# Patient Record
Sex: Male | Born: 1960 | Race: White | Hispanic: No | Marital: Married | Smoking: Never smoker
Health system: Southern US, Community
[De-identification: ages and names within clinical notes are randomized; demographics above are authoritative.]

## PROBLEM LIST (undated history)

## (undated) ENCOUNTER — Inpatient Hospital Stay: Admission: EM | Payer: Self-pay | Source: Home / Self Care

## (undated) DIAGNOSIS — E7212 Methylenetetrahydrofolate reductase deficiency: Secondary | ICD-10-CM

## (undated) DIAGNOSIS — Z86010 Personal history of colonic polyps: Secondary | ICD-10-CM

## (undated) DIAGNOSIS — R3129 Other microscopic hematuria: Secondary | ICD-10-CM

## (undated) DIAGNOSIS — Z87898 Personal history of other specified conditions: Secondary | ICD-10-CM

## (undated) DIAGNOSIS — D3A8 Other benign neuroendocrine tumors: Secondary | ICD-10-CM

## (undated) DIAGNOSIS — R011 Cardiac murmur, unspecified: Secondary | ICD-10-CM

## (undated) HISTORY — PX: COLONOSCOPY: SHX174

## (undated) HISTORY — DX: Personal history of colonic polyps: Z86.010

## (undated) HISTORY — DX: Other microscopic hematuria: R31.29

## (undated) HISTORY — DX: Methylenetetrahydrofolate reductase deficiency: E72.12

## (undated) HISTORY — DX: Other benign neuroendocrine tumors: D3A.8

## (undated) HISTORY — DX: Cardiac murmur, unspecified: R01.1

## (undated) HISTORY — DX: Personal history of other specified conditions: Z87.898

---

## 1987-12-28 HISTORY — PX: BRAIN TUMOR EXCISION: SHX577

## 1989-12-27 HISTORY — PX: HYDROCELE EXCISION / REPAIR: SUR1145

## 2004-09-03 ENCOUNTER — Encounter: Payer: Self-pay | Admitting: Internal Medicine

## 2007-09-01 ENCOUNTER — Ambulatory Visit: Payer: Self-pay | Admitting: Internal Medicine

## 2007-09-01 LAB — CONVERTED CEMR LAB
ALT: 29 units/L (ref 0–53)
AST: 21 units/L (ref 0–37)
Albumin: 4.2 g/dL (ref 3.5–5.2)
Alkaline Phosphatase: 66 units/L (ref 39–117)
BUN: 12 mg/dL (ref 6–23)
Basophils Absolute: 0 10*3/uL (ref 0.0–0.1)
Basophils Relative: 0.6 % (ref 0.0–1.0)
Bilirubin Urine: NEGATIVE
Bilirubin, Direct: 0.1 mg/dL (ref 0.0–0.3)
CO2: 28 meq/L (ref 19–32)
Calcium: 9.5 mg/dL (ref 8.4–10.5)
Chloride: 103 meq/L (ref 96–112)
Cholesterol: 181 mg/dL (ref 0–200)
Creatinine, Ser: 1 mg/dL (ref 0.4–1.5)
Eosinophils Absolute: 0.4 10*3/uL (ref 0.0–0.6)
Eosinophils Relative: 6.3 % — ABNORMAL HIGH (ref 0.0–5.0)
GFR calc Af Amer: 103 mL/min
GFR calc non Af Amer: 85 mL/min
Glucose, Bld: 107 mg/dL — ABNORMAL HIGH (ref 70–99)
Glucose, Urine, Semiquant: NEGATIVE
HCT: 45.7 % (ref 39.0–52.0)
HDL: 68.5 mg/dL (ref >39.0)
Hemoglobin: 15.4 g/dL (ref 13.0–17.0)
Ketones, urine, test strip: NEGATIVE
LDL Cholesterol: 90 mg/dL (ref 0–99)
Lymphocytes Relative: 36.8 % (ref 12.0–46.0)
MCHC: 33.7 g/dL (ref 30.0–36.0)
MCV: 98.9 fL (ref 78.0–100.0)
Monocytes Absolute: 0.6 10*3/uL (ref 0.2–0.7)
Monocytes Relative: 9.2 % (ref 3.0–11.0)
Neutro Abs: 3.4 10*3/uL (ref 1.4–7.7)
Neutrophils Relative %: 47.1 % (ref 43.0–77.0)
Nitrite: NEGATIVE
Platelets: 347 10*3/uL (ref 150–400)
Potassium: 4.5 meq/L (ref 3.5–5.1)
Protein, U semiquant: NEGATIVE
RBC: 4.62 M/uL (ref 4.22–5.81)
RDW: 12.4 % (ref 11.5–14.6)
Sodium: 139 meq/L (ref 135–145)
Specific Gravity, Urine: 1.025
TSH: 2.03 microintl units/mL (ref 0.35–5.50)
Total Bilirubin: 1.3 mg/dL — ABNORMAL HIGH (ref 0.3–1.2)
Total CHOL/HDL Ratio: 2.6
Total Protein: 7.5 g/dL (ref 6.0–8.3)
Triglycerides: 113 mg/dL (ref 0–149)
Urobilinogen, UA: 0.2
VLDL: 23 mg/dL (ref 0–40)
WBC Urine, dipstick: NEGATIVE
WBC: 7 10*3/uL (ref 4.5–10.5)
pH: 5.5

## 2007-09-08 ENCOUNTER — Ambulatory Visit: Payer: Self-pay | Admitting: Internal Medicine

## 2008-08-09 ENCOUNTER — Ambulatory Visit: Payer: Self-pay | Admitting: Internal Medicine

## 2008-09-16 ENCOUNTER — Ambulatory Visit: Payer: Self-pay | Admitting: Internal Medicine

## 2008-10-16 ENCOUNTER — Telehealth: Payer: Self-pay | Admitting: Internal Medicine

## 2008-11-01 ENCOUNTER — Telehealth (INDEPENDENT_AMBULATORY_CARE_PROVIDER_SITE_OTHER): Payer: Self-pay | Admitting: *Deleted

## 2008-12-27 LAB — HM COLONOSCOPY

## 2009-05-05 ENCOUNTER — Ambulatory Visit: Payer: Self-pay | Admitting: Internal Medicine

## 2009-05-05 LAB — CONVERTED CEMR LAB
ALT: 25 units/L (ref 0–53)
Basophils Relative: 0.9 % (ref 0.0–3.0)
Bilirubin Urine: NEGATIVE
Bilirubin, Direct: 0.2 mg/dL (ref 0.0–0.3)
Chloride: 107 meq/L (ref 96–112)
Cholesterol: 173 mg/dL (ref 0–200)
Eosinophils Relative: 6.8 % — ABNORMAL HIGH (ref 0.0–5.0)
HCT: 48.9 % (ref 39.0–52.0)
Hemoglobin: 16.7 g/dL (ref 13.0–17.0)
LDL Cholesterol: 83 mg/dL (ref 0–99)
Lymphs Abs: 1.9 10*3/uL (ref 0.7–4.0)
MCV: 100.4 fL — ABNORMAL HIGH (ref 78.0–100.0)
Monocytes Absolute: 0.5 10*3/uL (ref 0.1–1.0)
Nitrite: NEGATIVE
Potassium: 5.3 meq/L — ABNORMAL HIGH (ref 3.5–5.1)
RBC: 4.87 M/uL (ref 4.22–5.81)
TSH: 1.85 microintl units/mL (ref 0.35–5.50)
Total CHOL/HDL Ratio: 3
Total Protein: 7.1 g/dL (ref 6.0–8.3)
Urobilinogen, UA: 0.2
WBC: 5.5 10*3/uL (ref 4.5–10.5)

## 2009-05-12 ENCOUNTER — Ambulatory Visit: Payer: Self-pay | Admitting: Internal Medicine

## 2009-05-12 DIAGNOSIS — C719 Malignant neoplasm of brain, unspecified: Secondary | ICD-10-CM | POA: Insufficient documentation

## 2009-05-12 DIAGNOSIS — R29898 Other symptoms and signs involving the musculoskeletal system: Secondary | ICD-10-CM | POA: Insufficient documentation

## 2009-05-12 DIAGNOSIS — M79609 Pain in unspecified limb: Secondary | ICD-10-CM

## 2009-05-13 ENCOUNTER — Ambulatory Visit: Payer: Self-pay | Admitting: Internal Medicine

## 2009-05-16 ENCOUNTER — Telehealth: Payer: Self-pay | Admitting: Internal Medicine

## 2009-05-19 ENCOUNTER — Telehealth: Payer: Self-pay | Admitting: Internal Medicine

## 2009-05-19 ENCOUNTER — Encounter: Admission: RE | Admit: 2009-05-19 | Discharge: 2009-05-19 | Payer: Self-pay | Admitting: Internal Medicine

## 2009-05-29 DIAGNOSIS — D492 Neoplasm of unspecified behavior of bone, soft tissue, and skin: Secondary | ICD-10-CM

## 2009-06-03 ENCOUNTER — Ambulatory Visit: Payer: Self-pay | Admitting: Internal Medicine

## 2009-06-06 ENCOUNTER — Encounter: Payer: Self-pay | Admitting: Internal Medicine

## 2009-06-06 ENCOUNTER — Encounter (INDEPENDENT_AMBULATORY_CARE_PROVIDER_SITE_OTHER): Payer: Self-pay | Admitting: Interventional Radiology

## 2009-06-06 ENCOUNTER — Ambulatory Visit (HOSPITAL_COMMUNITY): Admission: RE | Admit: 2009-06-06 | Discharge: 2009-06-06 | Payer: Self-pay | Admitting: Internal Medicine

## 2009-06-12 ENCOUNTER — Telehealth: Payer: Self-pay | Admitting: Internal Medicine

## 2009-06-17 ENCOUNTER — Telehealth: Payer: Self-pay | Admitting: Internal Medicine

## 2009-06-19 ENCOUNTER — Encounter: Payer: Self-pay | Admitting: Internal Medicine

## 2009-06-19 ENCOUNTER — Ambulatory Visit: Payer: Self-pay | Admitting: Internal Medicine

## 2009-06-19 DIAGNOSIS — Z860100 Personal history of colon polyps, unspecified: Secondary | ICD-10-CM

## 2009-06-19 HISTORY — DX: Personal history of colonic polyps: Z86.010

## 2009-06-19 HISTORY — DX: Personal history of colon polyps, unspecified: Z86.0100

## 2009-06-22 ENCOUNTER — Encounter: Payer: Self-pay | Admitting: Internal Medicine

## 2009-10-27 IMAGING — US US BIOPSY
1 series · 3 of 3 positions shown · non-contrast
Comparison: none

CLINICAL DATA: Palpable left thigh mass.  Remote history of
astrocytoma.

ULTRASOUND-GUIDED LEFT THIGH ASPIRATION BIOPSY
TECHNIQUE: Survey ultrasound of the left thigh was performed and
the deep subcutaneous lesion was identified.  This measures
approximately 8 x 10 x 18 mm.  An appropriate skin entry site was
determined.  Site was marked, prepped with Betadine, draped in
usual sterile fashion, infiltrated locally with 1% lidocaine.
Intravenous Fentanyl and Versed were administered as conscious
sedation during continuous cardiorespiratory monitoring by the
radiology RN.
Under real time ultrasound guidance, three aspiration biopsy
samples of the lesion were obtained with 18 gauge needles, sent for
both cytology and microbiology analysis. The patient tolerated the
procedure well. No immediate complication
IMPRESSION
1.  Technically successful ultrasound-guided aspiration biopsy of
left thigh lesion.

[Series 1: us biopsy · 0.08mm/px · 3 acquisitions, 3 frames shown]
[im 1/3]
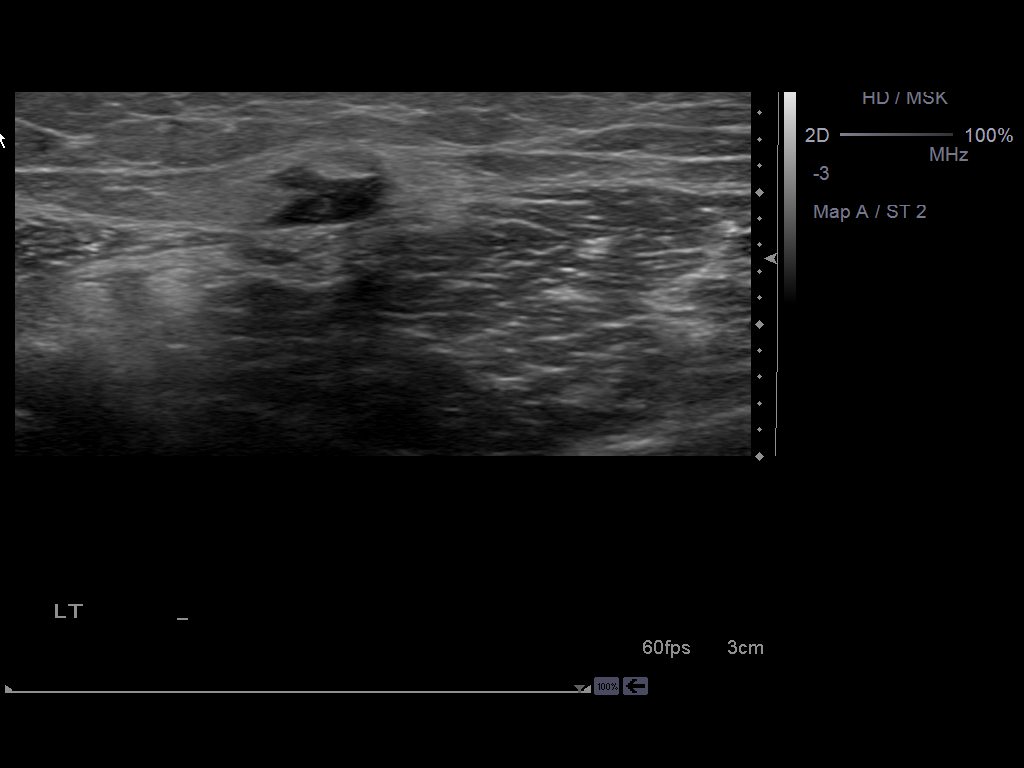
[im 2/3]
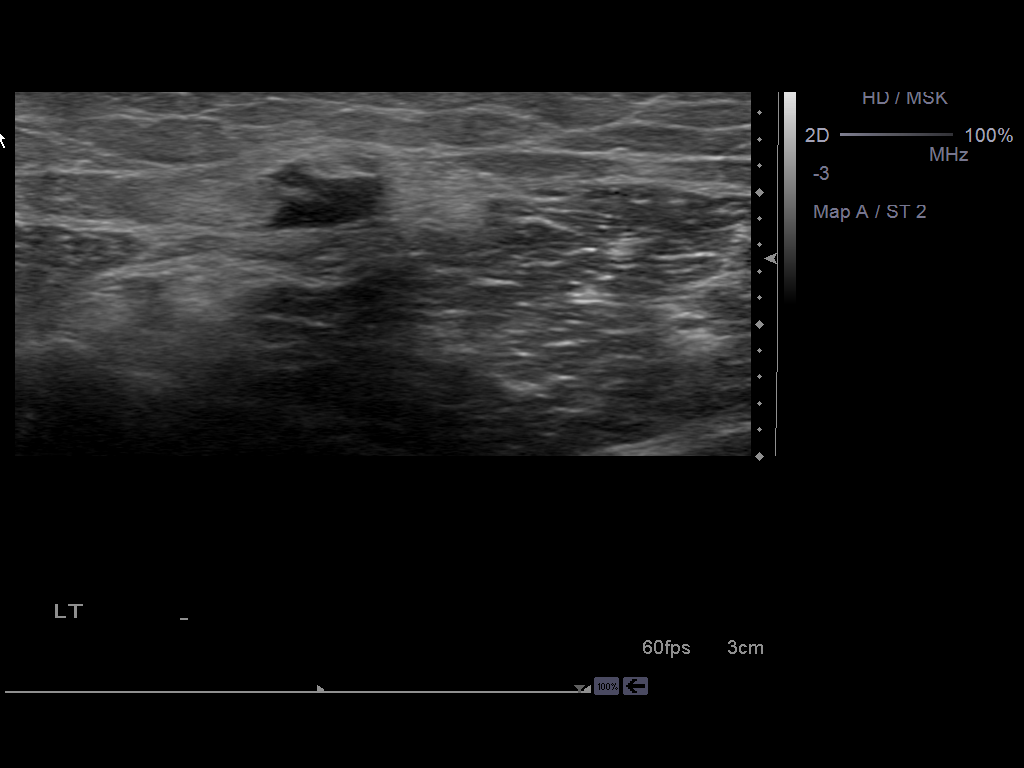
[im 3/3]
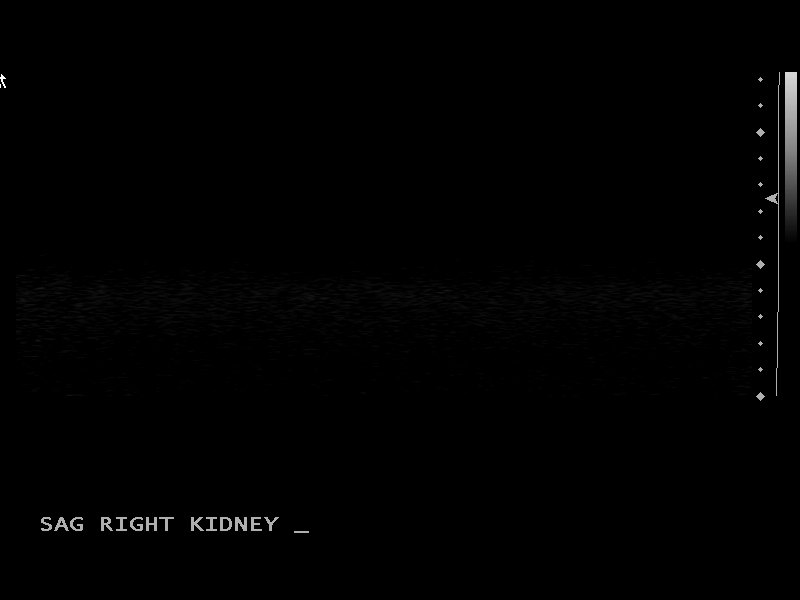

[3 of 3 positions shown; findings below may reference images not displayed]

## 2010-02-06 ENCOUNTER — Telehealth (INDEPENDENT_AMBULATORY_CARE_PROVIDER_SITE_OTHER): Payer: Self-pay | Admitting: *Deleted

## 2011-01-26 NOTE — Progress Notes (Signed)
Summary: Records request from [facility]  Request for records received from [facility]. Request forwarded to [facility].Dena Chavis  February 06, 2010 11:20 AM

## 2011-04-05 LAB — CBC
MCV: 100.8 fL — ABNORMAL HIGH (ref 78.0–100.0)
Platelets: 299 10*3/uL (ref 150–400)
WBC: 7.3 10*3/uL (ref 4.0–10.5)

## 2011-04-05 LAB — APTT: aPTT: 25 seconds (ref 24–37)

## 2011-04-05 LAB — CULTURE, ROUTINE-ABSCESS: Culture: NO GROWTH

## 2011-04-05 LAB — PROTIME-INR
INR: 1 (ref 0.00–1.49)
Prothrombin Time: 13.1 seconds (ref 11.6–15.2)

## 2011-07-02 ENCOUNTER — Other Ambulatory Visit: Payer: Self-pay | Admitting: Dermatology

## 2012-09-26 ENCOUNTER — Other Ambulatory Visit: Payer: Self-pay | Admitting: Dermatology

## 2013-04-05 ENCOUNTER — Ambulatory Visit (INDEPENDENT_AMBULATORY_CARE_PROVIDER_SITE_OTHER): Payer: 59 | Admitting: Family

## 2013-04-05 ENCOUNTER — Encounter: Payer: Self-pay | Admitting: Family

## 2013-04-05 VITALS — BP 130/90 | HR 73 | Temp 97.7°F | Resp 16 | Ht 73.75 in | Wt 181.0 lb

## 2013-04-05 DIAGNOSIS — H9190 Unspecified hearing loss, unspecified ear: Secondary | ICD-10-CM

## 2013-04-05 DIAGNOSIS — Z85841 Personal history of malignant neoplasm of brain: Secondary | ICD-10-CM

## 2013-04-05 DIAGNOSIS — Z86010 Personal history of colonic polyps: Secondary | ICD-10-CM

## 2013-04-05 DIAGNOSIS — Z0000 Encounter for general adult medical examination without abnormal findings: Secondary | ICD-10-CM

## 2013-04-05 DIAGNOSIS — Z23 Encounter for immunization: Secondary | ICD-10-CM

## 2013-04-05 DIAGNOSIS — F1010 Alcohol abuse, uncomplicated: Secondary | ICD-10-CM

## 2013-04-05 DIAGNOSIS — C719 Malignant neoplasm of brain, unspecified: Secondary | ICD-10-CM

## 2013-04-05 NOTE — Patient Instructions (Addendum)
Return fasting for lab work. Follow up in 1 year, sooner if problems/concerns. You will be contacted about your referral to audiology and for MRI.  Please let us know if you have not heard back within 1 week about your referrals.

## 2013-04-05 NOTE — Progress Notes (Signed)
Subjective:    Patient ID: Jason Arnold, male    DOB: [birth year], 52 y.o.   MRN: [id number]  HPI  Patient presents today to establish care.  He is also interested in arranging several referrals.  Immunizations: due for Tdap Diet: reports healthy diet- does admit to drinking 6 bourbons/day Exercise: walks in yard. active Colonoscopy: has had colo with polyp 2010 ([facility]) due for follow up in 2015.   Reports hx of brain tumor in his 20's removed most of the brain tumor. He has ben followed with serial MRI brain. Requests referral for follow up MRI.  Hearing loss- reports that he was told that he had some hearing loss on work physical. Requests referral to audiology.   Review of Systems See HPI  Past Medical History  Diagnosis Date  . 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 Marital Status: Married    Spouse Name: N/A    Number of Children: 2  . Years of Education: N/A   Occupational History  .     Social History Main Topics  . Smoking status: Never Smoker   . Smokeless tobacco: Never Used  . Alcohol Use: 21.0 oz/week    42 drink(s) per week     Comment: has about 6 drinks a day  . Drug Use: Not on file  . Sexually Active: Not on file   Other Topics Concern  . 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   Enjoys reading, working outside.   Son 28, girl 60   Completed doctoral degree in chemistry   Has a dog    Past Surgical History  Procedure Laterality Date  . Hydrocele excision / repair  1991  . 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 Cancer Mother     history of colon cancer in her 76's  . Hyperlipidemia Father   . Heart attack Father     No Known Allergies  No current outpatient prescriptions on file prior to visit.    No current facility-administered medications on file prior to visit.    BP 130/90  Pulse 73  Temp(Src) 97.7 F (36.5 C) (Oral)  Resp 16  Ht 6' 1.75" (1.873 m)  Wt 181 lb 0.6 oz (82.119 kg)  BMI 23.41 kg/m2  SpO2 99%       Objective:   Physical Exam Physical Exam  Constitutional: He is oriented to person, place, and time. He appears well-developed and well-nourished. No distress.  HENT:  Head: Normocephalic and atraumatic.  Right Ear: Tympanic membrane and ear canal normal.  Left Ear: Tympanic membrane and ear canal normal.  Mouth/Throat: Oropharynx is clear and moist.  Eyes: Pupils are equal, round, and reactive to light. No scleral icterus.  Neck: Normal range of motion. No thyromegaly present.  Cardiovascular: Normal rate and regular rhythm.   No murmur heard. Pulmonary/Chest: Effort normal and breath sounds normal. No respiratory distress. He has no wheezes. He has no rales. He exhibits no tenderness.  Abdominal: Soft. Bowel sounds are normal. He exhibits no distension and no mass. There is no tenderness. There is no rebound and no guarding.  Musculoskeletal: He exhibits no edema.  Lymphadenopathy:  He has no cervical adenopathy.  Neurological: He is alert and oriented to person, place, and time.  He exhibits normal muscle tone. Coordination normal.  Skin: Skin is warm and dry.  GU:  Normal prostate, stool heme neg Psychiatric: He has a normal mood and affect. His behavior is normal. Judgment and thought content normal.       Assessment & Plan:         Assessment & Plan:

## 2013-04-07 ENCOUNTER — Ambulatory Visit (HOSPITAL_BASED_OUTPATIENT_CLINIC_OR_DEPARTMENT_OTHER): Payer: 59

## 2013-04-08 DIAGNOSIS — F1010 Alcohol abuse, uncomplicated: Secondary | ICD-10-CM | POA: Insufficient documentation

## 2013-04-08 DIAGNOSIS — Z0000 Encounter for general adult medical examination without abnormal findings: Secondary | ICD-10-CM | POA: Insufficient documentation

## 2013-04-08 DIAGNOSIS — H9190 Unspecified hearing loss, unspecified ear: Secondary | ICD-10-CM | POA: Insufficient documentation

## 2013-04-08 NOTE — Assessment & Plan Note (Signed)
Will order MRI brain

## 2013-04-08 NOTE — Assessment & Plan Note (Signed)
Refer to audiology.

## 2013-04-08 NOTE — Assessment & Plan Note (Signed)
Drinks 6 drinks a day.  Advised pt re: recommendations for men not to exceed 2 drinks/day due to increased health risks.

## 2013-04-08 NOTE — Assessment & Plan Note (Signed)
Continue healthy diet, exercise, Tdap today.  He will return for fasting labs and PSA (we discussed pros/cons).

## 2013-04-14 ENCOUNTER — Ambulatory Visit (HOSPITAL_BASED_OUTPATIENT_CLINIC_OR_DEPARTMENT_OTHER)
Admit: 2013-04-14 | Discharge: 2013-04-14 | Disposition: A | Discharge status: Home / Self Care | Payer: 59 | Source: Ambulatory Visit | Attending: Family | Admitting: Family

## 2013-04-14 ENCOUNTER — Other Ambulatory Visit (HOSPITAL_BASED_OUTPATIENT_CLINIC_OR_DEPARTMENT_OTHER): Payer: 59

## 2013-04-14 DIAGNOSIS — Z09 Encounter for follow-up examination after completed treatment for conditions other than malignant neoplasm: Secondary | ICD-10-CM | POA: Insufficient documentation

## 2013-04-14 DIAGNOSIS — Z85841 Personal history of malignant neoplasm of brain: Secondary | ICD-10-CM | POA: Insufficient documentation

## 2013-04-14 MED ORDER — GADOBENATE DIMEGLUMINE 529 MG/ML IV SOLN
20.0000 mL | Freq: Once | INTRAVENOUS | Status: AC | PRN
  Administered 2013-04-14: 20 mL via INTRAVENOUS

## 2013-04-19 LAB — TSH: TSH: 2.341 u[IU]/mL (ref 0.350–4.500)

## 2013-04-19 LAB — LIPID PANEL
Cholesterol: 178 mg/dL (ref 0–200)
HDL: 77 mg/dL (ref >39)
Triglycerides: 71 mg/dL (ref <150)

## 2013-04-19 LAB — BASIC METABOLIC PANEL WITH GFR
CO2: 28 mEq/L (ref 19–32)
Calcium: 10 mg/dL (ref 8.4–10.5)
Creat: 0.91 mg/dL (ref 0.50–1.35)
GFR, Est African American: >89 mL/min
Glucose, Bld: 91 mg/dL (ref 70–99)

## 2013-04-19 LAB — HEPATIC FUNCTION PANEL
Albumin: 4.6 g/dL (ref 3.5–5.2)
Total Protein: 7.2 g/dL (ref 6.0–8.3)

## 2013-04-19 LAB — CBC WITH DIFFERENTIAL/PLATELET
Basophils Absolute: 0.1 10*3/uL (ref 0.0–0.1)
Basophils Relative: 2 % — ABNORMAL HIGH (ref 0–1)
Eosinophils Absolute: 0.2 10*3/uL (ref 0.0–0.7)
Eosinophils Relative: 4 % (ref 0–5)
MCH: 34 pg (ref 26.0–34.0)
MCV: 93.6 fL (ref 78.0–100.0)
Neutrophils Relative %: 47 % (ref 43–77)
Platelets: 367 10*3/uL (ref 150–400)
RDW: 13.3 % (ref 11.5–15.5)

## 2013-04-20 LAB — URINALYSIS, ROUTINE W REFLEX MICROSCOPIC
Glucose, UA: NEGATIVE mg/dL
Ketones, ur: NEGATIVE mg/dL
Nitrite: NEGATIVE
Specific Gravity, Urine: 1.006 (ref 1.005–1.030)
pH: 7 (ref 5.0–8.0)

## 2013-04-20 LAB — URINALYSIS, MICROSCOPIC ONLY
Casts: NONE SEEN
Crystals: NONE SEEN
Squamous Epithelial / LPF: NONE SEEN

## 2013-04-23 ENCOUNTER — Encounter: Payer: Self-pay | Admitting: Family

## 2014-05-14 ENCOUNTER — Other Ambulatory Visit: Payer: Self-pay | Admitting: Family

## 2014-05-14 ENCOUNTER — Ambulatory Visit (INDEPENDENT_AMBULATORY_CARE_PROVIDER_SITE_OTHER): Payer: 59 | Admitting: Family

## 2014-05-14 ENCOUNTER — Encounter: Payer: Self-pay | Admitting: Family

## 2014-05-14 VITALS — BP 124/82 | HR 77 | Temp 97.7°F | Resp 16 | Ht 73.5 in | Wt 180.0 lb

## 2014-05-14 DIAGNOSIS — R1030 Lower abdominal pain, unspecified: Secondary | ICD-10-CM

## 2014-05-14 DIAGNOSIS — R109 Unspecified abdominal pain: Secondary | ICD-10-CM

## 2014-05-14 DIAGNOSIS — H9190 Unspecified hearing loss, unspecified ear: Secondary | ICD-10-CM

## 2014-05-14 DIAGNOSIS — C719 Malignant neoplasm of brain, unspecified: Secondary | ICD-10-CM

## 2014-05-14 DIAGNOSIS — Z0000 Encounter for general adult medical examination without abnormal findings: Secondary | ICD-10-CM

## 2014-05-14 LAB — BASIC METABOLIC PANEL WITH GFR
BUN: 9 mg/dL (ref 6–23)
CALCIUM: 9.6 mg/dL (ref 8.4–10.5)
CO2: 30 mEq/L (ref 19–32)
Chloride: 100 mEq/L (ref 96–112)
Creat: 0.85 mg/dL (ref 0.50–1.35)
GFR, Est Non African American: >89 mL/min
GLUCOSE: 106 mg/dL — AB (ref 70–99)
POTASSIUM: 4.8 meq/L (ref 3.5–5.3)
SODIUM: 138 meq/L (ref 135–145)

## 2014-05-14 LAB — CBC WITH DIFFERENTIAL/PLATELET
Basophils Absolute: 0.1 10*3/uL (ref 0.0–0.1)
Basophils Relative: 1 % (ref 0–1)
EOS ABS: 0.2 10*3/uL (ref 0.0–0.7)
Eosinophils Relative: 4 % (ref 0–5)
HCT: 45.7 % (ref 39.0–52.0)
HEMOGLOBIN: 15.7 g/dL (ref 13.0–17.0)
LYMPHS ABS: 1.7 10*3/uL (ref 0.7–4.0)
LYMPHS PCT: 30 % (ref 12–46)
MCH: 32.8 pg (ref 26.0–34.0)
MCHC: 34.4 g/dL (ref 30.0–36.0)
MCV: 95.4 fL (ref 78.0–100.0)
MONOS PCT: 10 % (ref 3–12)
Monocytes Absolute: 0.6 10*3/uL (ref 0.1–1.0)
NEUTROS ABS: 3 10*3/uL (ref 1.7–7.7)
NEUTROS PCT: 55 % (ref 43–77)
PLATELETS: 359 10*3/uL (ref 150–400)
RBC: 4.79 MIL/uL (ref 4.22–5.81)
RDW: 13.1 % (ref 11.5–15.5)
WBC: 5.5 10*3/uL (ref 4.0–10.5)

## 2014-05-14 LAB — HEPATIC FUNCTION PANEL
ALBUMIN: 4.4 g/dL (ref 3.5–5.2)
ALK PHOS: 64 U/L (ref 39–117)
ALT: 25 U/L (ref 0–53)
AST: 20 U/L (ref 0–37)
BILIRUBIN DIRECT: 0.1 mg/dL (ref 0.0–0.3)
BILIRUBIN INDIRECT: 0.4 mg/dL (ref 0.2–1.2)
BILIRUBIN TOTAL: 0.5 mg/dL (ref 0.2–1.2)
Total Protein: 7.3 g/dL (ref 6.0–8.3)

## 2014-05-14 LAB — LIPID PANEL
CHOL/HDL RATIO: 2.1 ratio
Cholesterol: 164 mg/dL (ref 0–200)
HDL: 78 mg/dL (ref >39)
LDL CALC: 66 mg/dL (ref 0–99)
Triglycerides: 99 mg/dL (ref <150)
VLDL: 20 mg/dL (ref 0–40)

## 2014-05-14 NOTE — Patient Instructions (Addendum)
Please complete lab work prior to leaving. You will be contacted about your ultrasound and referral to GI. Follow up in 1 year, sooner if problems/concerns.

## 2014-05-14 NOTE — Progress Notes (Signed)
Subjective:    Patient ID: Jason Arnold, male    DOB: [birth year], 53 y.o.   MRN: [id number]  HPI  [name] is a 53 yr old male who presents today for cpx.  Patient presents today for complete physical.  Immunizations: up to date Diet: good Exercise: reports regular exerice Colonoscopy: due  Astrocytoma- pt had mri brain last year:   IMPRESSION:  Stable postoperative appearance of the brain.  Unchanged non-enhancing right lateral intraventricular mass since  2010 (and stable, by virtue of earlier comparisons done at that  time, since the year 2000).  No new intracranial abnormality.   Hearing loss- referred last visit to audiology. Reports that he has hearing loss in higher frequency.    R Groin pain- Hx of hydrocele repair, notes occasional discomfort right groin. R testicle is "always sensitive."   Reports he has cut down some on his alcohol consumption.  Review of Systems  Constitutional: Negative for unexpected weight change.  HENT: Negative for rhinorrhea.   Respiratory: Negative for cough.   Gastrointestinal: Negative for nausea and vomiting.  Neurological: Negative for headaches.  Hematological: Negative for adenopathy.  Psychiatric/Behavioral:       Denies depression/anxiety   Past Medical History  Diagnosis Date  . 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 Marital Status: Married    Spouse Name: N/A    Number of Children: 2  . Years of Education: N/A   Occupational History  .     Social History Main Topics  . Smoking status: Never Smoker   . Smokeless tobacco: Never Used  . Alcohol Use: 21.0 oz/week    42 drink(s) per week     Comment: has about 6 drinks a day  . Drug Use: Not on file  . Sexual Activity: Not on file   Other Topics Concern  . 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   Enjoys reading,  working outside.   Son 19, girl 59   Completed doctoral degree in chemistry   Has a dog    Past Surgical History  Procedure Laterality Date  . Hydrocele excision / repair  1991  . 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 Cancer Mother     history of colon cancer in her 29's  . Hyperlipidemia Father   . Heart attack Father     No Known Allergies  No current outpatient prescriptions on file prior to visit.   No current facility-administered medications on file prior to visit.    BP 124/82  Pulse 77  Temp(Src) 97.7 F (36.5 C) (Oral)  Resp 16  Ht 6' 1.5" (1.867 m)  Wt 180 lb (81.647 kg)  BMI 23.42 kg/m2  SpO2 99%       Objective:   Physical Exam  Physical Exam  Constitutional: He is oriented to person, place, and time. He appears well-developed and well-nourished. No distress.  HENT:  Head: Normocephalic and atraumatic.  Right Ear: Tympanic membrane and ear canal normal.  Left Ear: Tympanic membrane and ear canal normal.  Mouth/Throat: Oropharynx is clear and moist.  Eyes: Pupils are equal, round, and reactive to light. No scleral icterus.  Neck: Normal range of motion. No thyromegaly present.  Cardiovascular: Normal rate and regular rhythm.   No murmur heard. Pulmonary/Chest: Effort normal and breath sounds normal. No respiratory distress. He has no wheezes. He has no rales. He exhibits no tenderness.  Abdominal: Soft. Bowel sounds are normal. He exhibits no distension and no mass. There is no tenderness. There is no rebound and no guarding.  Musculoskeletal: He exhibits no edema.  Lymphadenopathy:    He has no cervical adenopathy.  Neurological: He is alert and oriented to person, place, and time. He has normal reflexes. He exhibits normal muscle tone. Coordination normal.  Skin: Skin is warm and dry.  Psychiatric: He has a normal mood and affect. His behavior is normal.  Judgment and thought content normal.  GU:  No palpable inguinal hernias noted.  R testicle is enlarged but not non-tender.          Assessment & Plan:          Assessment & Plan:

## 2014-05-14 NOTE — Assessment & Plan Note (Signed)
Refer for follow up colonoscopy.  Obtain non-fasting labs.

## 2014-05-14 NOTE — Progress Notes (Signed)
Pre visit review using our clinic review tool, if applicable. No additional management support is needed unless otherwise documented below in the visit note. 

## 2014-05-14 NOTE — Assessment & Plan Note (Signed)
Hearing loss at higher frequencies.  Was told hearing aid would not help.

## 2014-05-14 NOTE — Assessment & Plan Note (Signed)
Obtain testicular US, PSA.

## 2014-05-14 NOTE — Assessment & Plan Note (Signed)
Stable on imaging since 2000.

## 2014-05-15 ENCOUNTER — Encounter: Payer: Self-pay | Admitting: Family

## 2014-05-15 ENCOUNTER — Ambulatory Visit (HOSPITAL_BASED_OUTPATIENT_CLINIC_OR_DEPARTMENT_OTHER): Payer: 59

## 2014-05-15 LAB — PSA: PSA: 0.89 ng/mL (ref <=4.00)

## 2014-05-15 LAB — URINALYSIS, ROUTINE W REFLEX MICROSCOPIC
BILIRUBIN URINE: NEGATIVE
GLUCOSE, UA: NEGATIVE mg/dL
Ketones, ur: NEGATIVE mg/dL
Leukocytes, UA: NEGATIVE
Nitrite: NEGATIVE
PROTEIN: NEGATIVE mg/dL
Specific Gravity, Urine: 1.006 (ref 1.005–1.030)
Urobilinogen, UA: 0.2 mg/dL (ref 0.0–1.0)
pH: 6 (ref 5.0–8.0)

## 2014-05-15 LAB — URINALYSIS, MICROSCOPIC ONLY
Bacteria, UA: NONE SEEN
CASTS: NONE SEEN
Crystals: NONE SEEN
SQUAMOUS EPITHELIAL / LPF: NONE SEEN

## 2014-05-15 LAB — TSH: TSH: 1.657 u[IU]/mL (ref 0.350–4.500)

## 2014-05-17 ENCOUNTER — Encounter: Payer: Self-pay | Admitting: Internal Medicine

## 2014-05-17 ENCOUNTER — Other Ambulatory Visit: Payer: Self-pay | Admitting: Family

## 2014-05-17 DIAGNOSIS — R1030 Lower abdominal pain, unspecified: Secondary | ICD-10-CM

## 2014-05-22 ENCOUNTER — Ambulatory Visit (HOSPITAL_BASED_OUTPATIENT_CLINIC_OR_DEPARTMENT_OTHER)
Admission: RE | Admit: 2014-05-22 | Discharge: 2014-05-22 | Disposition: A | Discharge status: Home / Self Care | Payer: 59 | Source: Ambulatory Visit | Attending: Family | Admitting: Family

## 2014-05-22 DIAGNOSIS — R109 Unspecified abdominal pain: Secondary | ICD-10-CM | POA: Insufficient documentation

## 2014-05-22 DIAGNOSIS — R1030 Lower abdominal pain, unspecified: Secondary | ICD-10-CM

## 2014-05-22 DIAGNOSIS — I861 Scrotal varices: Secondary | ICD-10-CM | POA: Insufficient documentation

## 2014-05-22 DIAGNOSIS — N508 Other specified disorders of male genital organs: Secondary | ICD-10-CM | POA: Insufficient documentation

## 2014-05-23 ENCOUNTER — Encounter: Payer: Self-pay | Admitting: Internal Medicine

## 2014-05-24 ENCOUNTER — Telehealth: Payer: Self-pay | Admitting: Family

## 2014-05-24 DIAGNOSIS — I861 Scrotal varices: Secondary | ICD-10-CM

## 2014-05-24 NOTE — Telephone Encounter (Signed)
Left message to return my call.  

## 2014-05-24 NOTE — Telephone Encounter (Signed)
Please contact pt and let him know that I reviewed his testicular ultrasound.  Notes Varicocele on the right (cluster of blood vessels) and 2 cysts in the right testicle.  I would like for him to see urology for further evaluation.

## 2014-05-27 NOTE — Telephone Encounter (Signed)
Notified pt and he is agreeable to proceed with referral. 

## 2014-05-31 NOTE — Telephone Encounter (Signed)
Delsa Sale-- Can you check on the status of pt's urology referral. He left message that they haven't contacted him yet.  Thanks.

## 2014-06-05 ENCOUNTER — Encounter: Payer: Self-pay | Admitting: Physician Assistant

## 2014-06-05 ENCOUNTER — Ambulatory Visit (INDEPENDENT_AMBULATORY_CARE_PROVIDER_SITE_OTHER): Payer: 59 | Admitting: Physician Assistant

## 2014-06-05 VITALS — BP 132/84 | HR 69 | Temp 98.2°F | Resp 16 | Ht 73.5 in | Wt 179.0 lb

## 2014-06-05 DIAGNOSIS — W540XXA Bitten by dog, initial encounter: Secondary | ICD-10-CM

## 2014-06-05 DIAGNOSIS — S61409A Unspecified open wound of unspecified hand, initial encounter: Secondary | ICD-10-CM

## 2014-06-05 DIAGNOSIS — S61459A Open bite of unspecified hand, initial encounter: Secondary | ICD-10-CM | POA: Insufficient documentation

## 2014-06-05 MED ORDER — AMOXICILLIN-POT CLAVULANATE 875-125 MG PO TABS: 1.0000 | ORAL_TABLET | Freq: Two times a day (BID) | ORAL | Status: DC

## 2014-06-05 NOTE — Patient Instructions (Signed)
Please take antibiotic as directed with food.  Keep area clean and dry.  Apply topical neosporin.  If you develop worsening redness, tenderness, decreased range of motion, please call or return to office.  If you develop fever or chills, please proceed to an Urgent Care or ER as IV antibiotics may be needed.

## 2014-06-05 NOTE — Assessment & Plan Note (Signed)
Uncomplicated infection.  Rx Augmentin.  Wound care discussed.  Apply topical Neosporin.

## 2014-06-05 NOTE — Telephone Encounter (Signed)
Pt called stating he was returning our calls. Spoke with [facility] re: urology referral. Appt was scheduled for 06/06/14 at 2:45 with  Dr Karsten Ro. Pt states he was contacted by urology and had to r/s appt for next Tuesday.

## 2014-06-05 NOTE — Progress Notes (Signed)
Patient presents to clinic today c/o dog bite to his left palm this past Sunday. Patient noted over the past couple of days, the bite has become increasingly red and "angry" looking.  Denies drainage, but notes increasing tenderness.  Denies numbness, tingling, restricted ROM of affected hand.  Denies fever, chills or other signs of systemic infection.   Past Medical History  Diagnosis Date  . 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   No current facility-administered medications on file prior to visit.    No Known Allergies  Family History  Problem Relation Age of Onset  . Cancer Mother     history of colon cancer in her 54's  . Hyperlipidemia Father   . Heart attack Father     History   Social History  . Marital Status: Married    Spouse Name: N/A    Number of Children: 2  . Years of Education: N/A   Occupational History  .     Social History Main Topics  . Smoking status: Never Smoker   . Smokeless tobacco: Never Used  . Alcohol Use: 21.0 oz/week    42 drink(s) per week     Comment: has about 6 drinks a day  . Drug Use: None  . Sexual Activity: None   Other Topics Concern  . 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   Enjoys reading, working outside.   Son 59, girl 110   Completed doctoral degree in chemistry   Has a dog   Review of Systems - See HPI.  All other ROS are negative.  BP 132/84  Pulse 69  Temp(Src) 98.2 F (36.8 C) (Oral)  Resp 16  Ht 6' 1.5" (1.867 m)  Wt 179 lb (81.194 kg)  BMI 23.29 kg/m2  SpO2 98%  Physical Exam  Vitals reviewed. Constitutional: He is oriented to person, place, and time and well-developed, well-nourished, and in no distress.  HENT:  Head: Normocephalic and atraumatic.  Eyes: Conjunctivae are normal. Pupils are equal, round, and reactive to light.   Cardiovascular: Normal rate, regular rhythm, normal heart sounds and intact distal pulses.   Pulmonary/Chest: Effort normal and breath sounds normal. No respiratory distress. He has no wheezes. He has no rales. He exhibits no tenderness.  Neurological: He is alert and oriented to person, place, and time.  Skin: Skin is warm and dry.  Presence of a close puncture wound of left palm with surrounding erythema and noted tenderness.  Psychiatric: Affect normal.    Recent Results (from the past 2160 hour(s))  BASIC METABOLIC PANEL WITH GFR     Status: Abnormal   Collection Time    05/14/14  2:09 PM      Result Value Ref Range   Sodium 138  135 - 145 mEq/L   Potassium 4.8  3.5 - 5.3 mEq/L   Chloride 100  96 - 112 mEq/L   CO2 30  19 - 32 mEq/L   Glucose, Bld 106 (*) 70 - 99 mg/dL   BUN 9  6 - 23 mg/dL   Creat 0.85  0.50 - 1.35 mg/dL   Calcium 9.6  8.4 - 10.5 mg/dL   GFR, Est African American >89     GFR, Est Non African American >89     Comment:  The estimated GFR is a calculation valid for adults (>=59 years old)     that uses the CKD-EPI algorithm to adjust for age and sex. It is       not to be used for children, pregnant women, hospitalized patients,        patients on dialysis, or with rapidly changing kidney function.     According to the NKDEP, eGFR >89 is normal, 60-89 shows mild     impairment, 30-59 shows moderate impairment, 15-29 shows severe     impairment and <15 is ESRD.        CBC WITH DIFFERENTIAL     Status: None   Collection Time    05/14/14  2:09 PM      Result Value Ref Range   WBC 5.5  4.0 - 10.5 K/uL   RBC 4.79  4.22 - 5.81 MIL/uL   Hemoglobin 15.7  13.0 - 17.0 g/dL   HCT 45.7  39.0 - 52.0 %   MCV 95.4  78.0 - 100.0 fL   MCH 32.8  26.0 - 34.0 pg   MCHC 34.4  30.0 - 36.0 g/dL   RDW 13.1  11.5 - 15.5 %   Platelets 359  150 - 400 K/uL   Neutrophils Relative % 55  43 - 77 %   Neutro Abs 3.0  1.7 - 7.7 K/uL   Lymphocytes Relative 30  12 - 46 %   Lymphs  Abs 1.7  0.7 - 4.0 K/uL   Monocytes Relative 10  3 - 12 %   Monocytes Absolute 0.6  0.1 - 1.0 K/uL   Eosinophils Relative 4  0 - 5 %   Eosinophils Absolute 0.2  0.0 - 0.7 K/uL   Basophils Relative 1  0 - 1 %   Basophils Absolute 0.1  0.0 - 0.1 K/uL   Smear Review Criteria for review not met    HEPATIC FUNCTION PANEL     Status: None   Collection Time    05/14/14  2:09 PM      Result Value Ref Range   Total Bilirubin 0.5  0.2 - 1.2 mg/dL   Bilirubin, Direct 0.1  0.0 - 0.3 mg/dL   Indirect Bilirubin 0.4  0.2 - 1.2 mg/dL   Alkaline Phosphatase 64  39 - 117 U/L   AST 20  0 - 37 U/L   ALT 25  0 - 53 U/L   Total Protein 7.3  6.0 - 8.3 g/dL   Albumin 4.4  3.5 - 5.2 g/dL  LIPID PANEL     Status: None   Collection Time    05/14/14  2:09 PM      Result Value Ref Range   Cholesterol 164  0 - 200 mg/dL   Comment: ATP III Classification:           < 200        mg/dL        Desirable          200 - 239     mg/dL        Borderline High          >= 240        mg/dL        High         Triglycerides 99  <150 mg/dL   HDL 78  >39 mg/dL   Total CHOL/HDL Ratio 2.1     VLDL 20  0 - 40 mg/dL   LDL Cholesterol  66  0 - 99 mg/dL   Comment:       Total Cholesterol/HDL Ratio:CHD Risk                            Coronary Heart Disease Risk Table                                            Men       Women              1/2 Average Risk              3.4        3.3                  Average Risk              5.0        4.4               2X Average Risk              9.6        7.1               3X Average Risk             23.4       11.0     Use the calculated Patient Ratio above and the CHD Risk table      to determine the patient's CHD Risk.     ATP III Classification (LDL):           < 100        mg/dL         Optimal          100 - 129     mg/dL         Near or Above Optimal          130 - 159     mg/dL         Borderline High          160 - 189     mg/dL         High           > 190        mg/dL          Very High        TSH     Status: None   Collection Time    05/14/14  2:09 PM      Result Value Ref Range   TSH 1.657  0.350 - 4.500 uIU/mL  URINALYSIS, ROUTINE W REFLEX MICROSCOPIC     Status: Abnormal   Collection Time    05/14/14  2:09 PM      Result Value Ref Range   Color, Urine YELLOW  YELLOW   APPearance CLEAR  CLEAR   Specific Gravity, Urine 1.006  1.005 - 1.030   pH 6.0  5.0 - 8.0   Glucose, UA NEG  NEG mg/dL   Bilirubin Urine NEG  NEG   Ketones, ur NEG  NEG mg/dL   Hgb urine dipstick SMALL (*) NEG   Protein, ur NEG  NEG mg/dL   Urobilinogen, UA 0.2  0.0 - 1.0 mg/dL   Nitrite NEG  NEG   Leukocytes, UA NEG  NEG  PSA     Status: None   Collection Time    05/14/14  2:09 PM      Result Value Ref Range   PSA 0.89  <=4.00 ng/mL   Comment: Test Methodology: ECLIA PSA (Electrochemiluminescence Immunoassay)           For PSA values from 2.5-4.0, particularly in younger men <60 years     old, the AUA and NCCN suggest testing for % Free PSA (3515) and     evaluation of the rate of increase in PSA (PSA velocity).  URINALYSIS, MICROSCOPIC ONLY     Status: None   Collection Time    05/14/14  2:09 PM      Result Value Ref Range   Squamous Epithelial / LPF NONE SEEN  RARE   Crystals NONE SEEN  NONE SEEN   Casts NONE SEEN  NONE SEEN   WBC, UA 0-2  <3 WBC/hpf   RBC / HPF 0-2  <3 RBC/hpf   Bacteria, UA NONE SEEN  RARE    Assessment/Plan: Dog bite of hand Uncomplicated infection.  Rx Augmentin.  Wound care discussed.  Apply topical Neosporin.

## 2014-06-05 NOTE — Progress Notes (Signed)
Pre visit review using our clinic review tool, if applicable. No additional management support is needed unless otherwise documented below in the visit note/SLS  

## 2014-06-12 ENCOUNTER — Other Ambulatory Visit (HOSPITAL_COMMUNITY): Payer: Self-pay | Admitting: Urology

## 2014-06-12 DIAGNOSIS — I861 Scrotal varices: Secondary | ICD-10-CM

## 2014-06-17 ENCOUNTER — Ambulatory Visit (HOSPITAL_COMMUNITY): Payer: 59

## 2014-07-03 ENCOUNTER — Ambulatory Visit (AMBULATORY_SURGERY_CENTER): Payer: 59 | Admitting: *Deleted

## 2014-07-03 VITALS — Ht 74.0 in | Wt 181.4 lb

## 2014-07-03 DIAGNOSIS — Z86010 Personal history of colonic polyps: Secondary | ICD-10-CM

## 2014-07-03 MED ORDER — NA SULFATE-K SULFATE-MG SULF 17.5-3.13-1.6 GM/177ML PO SOLN: 1.0000 | Freq: Once | ORAL | Status: DC

## 2014-07-03 NOTE — Progress Notes (Signed)
No allergies to eggs or soy. No problems with anesthesia.  Pt given Emmi instructions for colonoscopy  No oxygen use  No diet drug use  

## 2014-07-17 ENCOUNTER — Encounter: Payer: Self-pay | Admitting: Internal Medicine

## 2014-07-17 ENCOUNTER — Ambulatory Visit (AMBULATORY_SURGERY_CENTER): Payer: 59 | Admitting: Internal Medicine

## 2014-07-17 VITALS — BP 113/80 | HR 58 | Temp 97.3°F | Resp 19 | Ht 74.0 in | Wt 181.0 lb

## 2014-07-17 DIAGNOSIS — D125 Benign neoplasm of sigmoid colon: Secondary | ICD-10-CM

## 2014-07-17 DIAGNOSIS — D126 Benign neoplasm of colon, unspecified: Secondary | ICD-10-CM

## 2014-07-17 DIAGNOSIS — Z800 Family history of malignant neoplasm of digestive organs: Secondary | ICD-10-CM | POA: Insufficient documentation

## 2014-07-17 DIAGNOSIS — Z86010 Personal history of colonic polyps: Secondary | ICD-10-CM

## 2014-07-17 DIAGNOSIS — D120 Benign neoplasm of cecum: Secondary | ICD-10-CM

## 2014-07-17 MED ORDER — SODIUM CHLORIDE 0.9 % IV SOLN: 500.0000 mL | INTRAVENOUS | Status: DC

## 2014-07-17 NOTE — Progress Notes (Signed)
Patient awakening,vss,report to rn 

## 2014-07-17 NOTE — Patient Instructions (Addendum)
I found and removed 2 small polyps.  I will let you know pathology results and when to have another routine colonoscopy by mail.  I appreciate the opportunity to care for you. Gatha Mayer, MD, FACG     YOU HAD AN ENDOSCOPIC PROCEDURE TODAY AT [facility]: Refer to the procedure report that was given to you for any specific questions about what was found during the examination.  If the procedure report does not answer your questions, please call your gastroenterologist to clarify.  If you requested that your care partner not be given the details of your procedure findings, then the procedure report has been included in a sealed envelope for you to review at your convenience later.  YOU SHOULD EXPECT: Some feelings of bloating in the abdomen. Passage of more gas than usual.  Walking can help get rid of the air that was put into your GI tract during the procedure and reduce the bloating. If you had a lower endoscopy (such as a colonoscopy or flexible sigmoidoscopy) you may notice spotting of blood in your stool or on the toilet paper. If you underwent a bowel prep for your procedure, then you may not have a normal bowel movement for a few days.  DIET: Your first meal following the procedure should be a light meal and then it is ok to progress to your normal diet.  A half-sandwich or bowl of soup is an example of a good first meal.  Heavy or fried foods are harder to digest and may make you feel nauseous or bloated.  Likewise meals heavy in dairy and vegetables can cause extra gas to form and this can also increase the bloating.  Drink plenty of fluids but you should avoid alcoholic beverages for 24 hours.  ACTIVITY: Your care partner should take you home directly after the procedure.  You should plan to take it easy, moving slowly for the rest of the day.  You can resume normal activity the day after the procedure however you should NOT DRIVE or use heavy machinery for 24 hours  (because of the sedation medicines used during the test).    SYMPTOMS TO REPORT IMMEDIATELY: A gastroenterologist can be reached at any hour.  During normal business hours, 8:30 AM to 5:00 PM Monday through Friday, call [phone].  After hours and on weekends, please call the GI answering service at [phone] who will take a message and have the physician on call contact you.   Following lower endoscopy (colonoscopy or flexible sigmoidoscopy):  Excessive amounts of blood in the stool  Significant tenderness or worsening of abdominal pains  Swelling of the abdomen that is new, acute  Fever of 100F or higher  FOLLOW UP: If any biopsies were taken you will be contacted by phone or by letter within the next 1-3 weeks.  Call your gastroenterologist if you have not heard about the biopsies in 3 weeks.  Our staff will call the home number listed on your records the next business day following your procedure to check on you and address any questions or concerns that you may have at that time regarding the information given to you following your procedure. This is a courtesy call and so if there is no answer at the home number and we have not heard from you through the emergency physician on call, we will assume that you have returned to your regular daily activities without incident.  SIGNATURES/CONFIDENTIALITY: You and/or your care partner have  signed paperwork which will be entered into your electronic medical record.  These signatures attest to the fact that that the information above on your After Visit Summary has been reviewed and is understood.  Full responsibility of the confidentiality of this discharge information lies with you and/or your care-partner.   Resume medications. Information given on polyps with discharge instructions.

## 2014-07-17 NOTE — Op Note (Addendum)
[facility]  [address]. [place], [address]   COLONOSCOPY PROCEDURE REPORT  PATIENT: Jason Arnold, Jason Arnold  MR#: [id number] BIRTHDATE: 30-Nov-1961 , 18  yrs. old GENDER: Male ENDOSCOPIST: Gatha Mayer, MD, [facility] PROCEDURE DATE:  07/17/2014 PROCEDURE:   Colonoscopy with biopsy and snare polypectomy First Screening Colonoscopy - Avg.  risk and is 50 yrs.  old or older - No.  Prior Negative Screening - Now for repeat screening. N/A  History of Adenoma - Now for follow-up colonoscopy & has been > or = to 3 yrs.  Yes hx of adenoma.  Has been 3 or more years since last colonoscopy.  Polyps Removed Today? Yes. ASA CLASS:   Class II INDICATIONS:Patient's personal history of adenomatous colon polyps. FHx CRCA - mother MEDICATIONS: propofol (Diprivan) 350mg  IV, MAC sedation, administered by CRNA, and These medications were titrated to patient response per physician's verbal order  DESCRIPTION OF PROCEDURE:   After the risks benefits and alternatives of the procedure were thoroughly explained, informed consent was obtained.  A digital rectal exam revealed no abnormalities of the rectum, A digital rectal exam revealed no prostatic nodules, and A digital rectal exam revealed the prostate was not enlarged.   The LB CF-H180AL Loaner [id number]  endoscope was introduced through the anus and advanced to the cecum, which was identified by both the appendix and ileocecal valve. No adverse events experienced.   The quality of the prep was excellent using Suprep  The instrument was then slowly withdrawn as the colon was fully examined.  COLON FINDINGS: Two sessile polyps measuring 2 and 5 mm in size were found at the cecum and in the sigmoid colon.  A polypectomy was performed with cold forceps and with a cold snare.  The resection was complete and the polyp tissue was completely retrieved.   The colon mucosa was otherwise normal.   A right colon retroflexion was performed.  Retroflexed  views revealed no abnormalities. The time to cecum=2 minutes 05 seconds.  Withdrawal time=9 minutes 08 seconds.  The scope was withdrawn and the procedure completed. COMPLICATIONS: There were no complications.  ENDOSCOPIC IMPRESSION: 1.   Two sessile polyps measuring 2 and 5 mm in size were found at the cecum and in the sigmoid colon; polypectomy was performed with cold forceps and with a cold snare 2.   The colon mucosa was otherwise normal - excellent prep - hx adenoma 2010 and FGHx CRCA mother  RECOMMENDATIONS: Timing of repeat colonoscopy will be determined by pathology findings.   eSigned:  Gatha Mayer, MD, [facility] 07/17/2014 12:12 PM   cc: The Patient

## 2014-07-17 NOTE — Progress Notes (Signed)
Called to room to assist during endoscopic procedure.  Patient ID and intended procedure confirmed with present staff. Received instructions for my participation in the procedure from the performing physician.  

## 2014-07-18 ENCOUNTER — Telehealth: Payer: Self-pay | Admitting: *Deleted

## 2014-07-18 NOTE — Telephone Encounter (Signed)
  Follow up Call-  Call back number 07/17/2014  Post procedure Call Back phone  # [phone]  Permission to leave phone message Yes     Patient questions:  Do you have a fever, pain , or abdominal swelling? No. Pain Score  0 *  Have you tolerated food without any problems? Yes.    Have you been able to return to your normal activities? Yes.    Do you have any questions about your discharge instructions: Diet   No. Medications  No. Follow up visit  No.  Do you have questions or concerns about your Care? No.  Actions: * If pain score is 4 or above: No action needed, pain <4.

## 2014-07-24 ENCOUNTER — Encounter: Payer: Self-pay | Admitting: Internal Medicine

## 2014-07-24 NOTE — Progress Notes (Signed)
Quick Note:  Diminutive adenoma and polypoid mucosa Repeat colon 2020 ______

## 2014-08-01 ENCOUNTER — Encounter: Payer: Self-pay | Admitting: Internal Medicine

## 2015-01-07 ENCOUNTER — Encounter: Payer: 59 | Admitting: Family

## 2015-04-29 ENCOUNTER — Telehealth: Payer: Self-pay | Admitting: Family

## 2015-04-29 NOTE — Telephone Encounter (Signed)
Pre visit letter sent  °

## 2015-05-07 ENCOUNTER — Encounter: Payer: 59 | Admitting: Family

## 2015-05-19 ENCOUNTER — Telehealth: Payer: Self-pay | Admitting: *Deleted

## 2015-05-19 NOTE — Telephone Encounter (Signed)
Unable to reach patient at time of Pre-Visit Call.  Left message with wife confirming time of appointment.

## 2015-05-20 ENCOUNTER — Ambulatory Visit (INDEPENDENT_AMBULATORY_CARE_PROVIDER_SITE_OTHER): Payer: 59 | Admitting: Family

## 2015-05-20 ENCOUNTER — Encounter: Payer: Self-pay | Admitting: Family

## 2015-05-20 VITALS — BP 128/82 | HR 72 | Temp 97.8°F | Resp 16 | Ht 73.5 in | Wt 175.2 lb

## 2015-05-20 DIAGNOSIS — Z0000 Encounter for general adult medical examination without abnormal findings: Secondary | ICD-10-CM

## 2015-05-20 NOTE — Assessment & Plan Note (Signed)
Continue healthy diet, exercise, obtain follow up lab work.

## 2015-05-20 NOTE — Progress Notes (Signed)
Pre visit review using our clinic review tool, if applicable. No additional management support is needed unless otherwise documented below in the visit note. 

## 2015-05-20 NOTE — Progress Notes (Signed)
Subjective:    Patient ID: Jason Arnold, male    DOB: 06-12-1961, 54 y.o.   MRN: [id number]  HPI  [name] is a 54 yr old male who presents today for cpx.  Immunizations: tetanus up to date Diet: healthy diet Exercise: walks a lot, active outside Colonoscopy: 7/15- was told 5 yr follow up. Dental:  Requesting C677T testing, reports sister has mutation.   Has microscopic hematuria and reports neg CT scan performed by urology.  Review of Systems  Constitutional: Negative for unexpected weight change.  HENT: Negative for rhinorrhea.        Reports some hearing loss being followed by audiology  Eyes: Negative for visual disturbance.  Respiratory: Negative for cough and shortness of breath.   Cardiovascular: Negative for chest pain.  Gastrointestinal: Negative for diarrhea and constipation.  Genitourinary: Negative for dysuria and frequency.  Musculoskeletal:       Occasional pain down the back of the right leg and across the right abdomen with sitting, comes and goes.   Neurological: Negative for headaches.  Hematological: Negative for adenopathy.  Psychiatric/Behavioral:       Denies depression/anxiety   Past Medical History  Diagnosis Date  . 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 Personal history of colonic polyps - adenoma 06/19/2009    History   Social History  . Marital Status: Married    Spouse Name: N/A  . Number of Children: 2  . Years of Education: N/A   Occupational History  .     Social History Main Topics  . Smoking status: Never Smoker   . Smokeless tobacco: Never Used  . Alcohol Use: 21.0 oz/week    42 Standard drinks or equivalent per week     Comment: has about 6 drinks a day  . Drug Use: No  . Sexual Activity: Not on file   Other Topics Concern  . 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   Enjoys reading, working outside.   Son 60, girl 52   Completed doctoral degree in chemistry   Has a dog    Past Surgical History  Procedure Laterality Date  . Hydrocele excision / repair  1991  . 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 Cancer Mother     history of colon cancer in her 23's  . Colon cancer Mother 12  . Hyperlipidemia Father   . Heart attack Father   . Other Sister     MTHFR C677T mutation    No Known Allergies  No current outpatient prescriptions on file prior to visit.   No current facility-administered medications on file prior to visit.    BP 128/82 mmHg  Pulse 72  Temp(Src) 97.8 F (36.6 C) (Oral)  Resp 16  Ht 6' 1.5" (1.867 m)  Wt 175 lb 3.2 oz (79.47 kg)  BMI 22.80 kg/m2  SpO2 98%       Objective:   Physical Exam  Physical Exam  Constitutional: He is oriented to person, place, and time. He appears well-developed and well-nourished. No distress.  HENT:  Head: Normocephalic and atraumatic.  Right Ear: Tympanic membrane and ear canal normal.  Left Ear: Tympanic membrane and ear canal normal.  Mouth/Throat: Oropharynx is clear and moist.  Eyes: Pupils are equal, round, and reactive to light. No scleral icterus.  Neck: Normal range of motion. No thyromegaly present.  Cardiovascular: Normal rate and regular rhythm.   No murmur heard. Pulmonary/Chest: Effort normal and breath sounds normal. No respiratory distress. He has no wheezes. He has no rales. He exhibits no tenderness.  Abdominal: Soft. Bowel sounds are normal. He exhibits no distension and no mass. There is no tenderness. There is no rebound and no guarding.  Musculoskeletal: He exhibits no edema.  Lymphadenopathy:    He has no cervical adenopathy.  Neurological: He is alert and oriented to person, place, and time. He has normal patellar reflexes. He exhibits normal muscle tone. Coordination normal.  Skin: Skin is warm and dry.    Psychiatric: He has a normal mood and affect. His behavior is normal. Judgment and thought content normal.          Assessment & Plan:        Assessment & Plan:

## 2015-05-21 LAB — CBC WITH DIFFERENTIAL/PLATELET
BASOS PCT: 0.7 % (ref 0.0–3.0)
Basophils Absolute: 0 10*3/uL (ref 0.0–0.1)
EOS ABS: 0.3 10*3/uL (ref 0.0–0.7)
Eosinophils Relative: 4.4 % (ref 0.0–5.0)
HEMATOCRIT: 46.5 % (ref 39.0–52.0)
HEMOGLOBIN: 15.7 g/dL (ref 13.0–17.0)
LYMPHS ABS: 1.6 10*3/uL (ref 0.7–4.0)
LYMPHS PCT: 28.1 % (ref 12.0–46.0)
MCHC: 33.7 g/dL (ref 30.0–36.0)
MCV: 99.4 fl (ref 78.0–100.0)
MONO ABS: 0.6 10*3/uL (ref 0.1–1.0)
Monocytes Relative: 11.4 % (ref 3.0–12.0)
NEUTROS PCT: 55.4 % (ref 43.0–77.0)
Neutro Abs: 3.1 10*3/uL (ref 1.4–7.7)
PLATELETS: 333 10*3/uL (ref 150.0–400.0)
RBC: 4.68 Mil/uL (ref 4.22–5.81)
RDW: 13.2 % (ref 11.5–15.5)
WBC: 5.7 10*3/uL (ref 4.0–10.5)

## 2015-05-21 LAB — BASIC METABOLIC PANEL
BUN: 10 mg/dL (ref 6–23)
CO2: 28 mEq/L (ref 19–32)
Calcium: 9.5 mg/dL (ref 8.4–10.5)
Chloride: 101 mEq/L (ref 96–112)
Creatinine, Ser: 0.86 mg/dL (ref 0.40–1.50)
GFR: 97.78 mL/min (ref >60.00)
Glucose, Bld: 77 mg/dL (ref 70–99)
Potassium: 4.4 mEq/L (ref 3.5–5.1)
SODIUM: 136 meq/L (ref 135–145)

## 2015-05-21 LAB — HEPATIC FUNCTION PANEL
ALK PHOS: 71 U/L (ref 39–117)
ALT: 18 U/L (ref 0–53)
AST: 19 U/L (ref 0–37)
Albumin: 4.4 g/dL (ref 3.5–5.2)
Bilirubin, Direct: 0.1 mg/dL (ref 0.0–0.3)
Total Bilirubin: 0.5 mg/dL (ref 0.2–1.2)
Total Protein: 7.4 g/dL (ref 6.0–8.3)

## 2015-05-21 LAB — LIPID PANEL
CHOL/HDL RATIO: 2
CHOLESTEROL: 173 mg/dL (ref 0–200)
HDL: 86.5 mg/dL (ref >39.00)
LDL CALC: 67 mg/dL (ref 0–99)
NONHDL: 86.5
TRIGLYCERIDES: 100 mg/dL (ref 0.0–149.0)
VLDL: 20 mg/dL (ref 0.0–40.0)

## 2015-05-21 LAB — TSH: TSH: 1.66 u[IU]/mL (ref 0.35–4.50)

## 2015-05-28 ENCOUNTER — Telehealth: Payer: Self-pay | Admitting: *Deleted

## 2015-05-28 NOTE — Telephone Encounter (Signed)
Spoke with Pecolia Ades at [facility] and she will fax result. Had incorrect fax # in their system. Fax re-sent to [place]. Awaiting result.

## 2015-05-28 NOTE — Telephone Encounter (Signed)
-----   Message from Debbrah Alar, NP sent at 05/28/2015  2:41 PM EDT ----- Could you please check status of MTHFR DNA Analysis

## 2015-06-02 NOTE — Telephone Encounter (Signed)
Result received and forwarded to Provider for review.  Please advise.

## 2015-06-04 NOTE — Telephone Encounter (Signed)
I did review pt's results. Please let patient know that his results show that he does carry two copies of teh MTHFR gene mutation. I would recommend that he take the following vitamins as pended below due to this finding. All other labs were normal.

## 2015-06-04 NOTE — Telephone Encounter (Signed)
Left message on home # to return my call. 

## 2015-06-05 NOTE — Telephone Encounter (Signed)
Pt would like to pick up lab results on his way to work in the morning at 7:30am 06/06/15

## 2015-06-06 MED ORDER — FOLIC ACID 1 MG PO TABS: 1.0000 mg | ORAL_TABLET | Freq: Every day | ORAL | Status: DC

## 2015-06-06 MED ORDER — VITAMIN B-6 25 MG PO TABS
25.0000 mg | ORAL_TABLET | Freq: Every day | ORAL | Status: DC
Start: 2015-06-06 — End: 2016-06-14

## 2015-06-06 MED ORDER — CYANOCOBALAMIN 500 MCG PO TABS: 500.0000 ug | ORAL_TABLET | Freq: Every day | ORAL | Status: DC

## 2015-06-06 NOTE — Telephone Encounter (Signed)
Notified pt. Copy given to pt but MTHFR result has not been scanned into EMR yet. Advised pt I will call and leave message on home # once copy has been scanned. Pt prefers to pick up copy once ready.

## 2015-06-18 NOTE — Telephone Encounter (Signed)
Results has been scanned to EMR and mailed to pt. Left detailed message on home # re: status.

## 2016-06-07 ENCOUNTER — Encounter: Payer: 59 | Admitting: Family

## 2016-06-14 ENCOUNTER — Ambulatory Visit (INDEPENDENT_AMBULATORY_CARE_PROVIDER_SITE_OTHER): Payer: 59 | Admitting: Family

## 2016-06-14 ENCOUNTER — Encounter: Payer: Self-pay | Admitting: Family

## 2016-06-14 VITALS — BP 130/70 | HR 57 | Temp 97.8°F | Resp 16 | Ht 73.5 in | Wt 171.0 lb

## 2016-06-14 DIAGNOSIS — E7212 Methylenetetrahydrofolate reductase deficiency: Secondary | ICD-10-CM | POA: Diagnosis not present

## 2016-06-14 DIAGNOSIS — Z0000 Encounter for general adult medical examination without abnormal findings: Secondary | ICD-10-CM

## 2016-06-14 DIAGNOSIS — Z1589 Genetic susceptibility to other disease: Secondary | ICD-10-CM

## 2016-06-14 HISTORY — DX: Genetic susceptibility to other disease: Z15.89

## 2016-06-14 LAB — CBC WITH DIFFERENTIAL/PLATELET
BASOS ABS: 0.1 10*3/uL (ref 0.0–0.1)
BASOS PCT: 2 % (ref 0.0–3.0)
Eosinophils Absolute: 0.6 10*3/uL (ref 0.0–0.7)
Eosinophils Relative: 13.3 % — ABNORMAL HIGH (ref 0.0–5.0)
HCT: 44.3 % (ref 39.0–52.0)
Hemoglobin: 15 g/dL (ref 13.0–17.0)
LYMPHS PCT: 37.7 % (ref 12.0–46.0)
Lymphs Abs: 1.6 10*3/uL (ref 0.7–4.0)
MCHC: 33.9 g/dL (ref 30.0–36.0)
MCV: 99.9 fl (ref 78.0–100.0)
MONO ABS: 0.4 10*3/uL (ref 0.1–1.0)
Monocytes Relative: 8.4 % (ref 3.0–12.0)
NEUTROS ABS: 1.7 10*3/uL (ref 1.4–7.7)
Neutrophils Relative %: 38.6 % — ABNORMAL LOW (ref 43.0–77.0)
PLATELETS: 306 10*3/uL (ref 150.0–400.0)
RBC: 4.44 Mil/uL (ref 4.22–5.81)
RDW: 13.2 % (ref 11.5–15.5)
WBC: 4.4 10*3/uL (ref 4.0–10.5)

## 2016-06-14 LAB — PSA: PSA: 0.97 ng/mL (ref 0.10–4.00)

## 2016-06-14 LAB — VITAMIN B12: Vitamin B-12: 378 pg/mL (ref 211–911)

## 2016-06-14 LAB — TSH: TSH: 2.28 u[IU]/mL (ref 0.35–4.50)

## 2016-06-14 LAB — LIPID PANEL
Cholesterol: 174 mg/dL (ref 0–200)
HDL: 93 mg/dL (ref >39.00)
LDL CALC: 60 mg/dL (ref 0–99)
NONHDL: 80.56
TRIGLYCERIDES: 105 mg/dL (ref 0.0–149.0)
Total CHOL/HDL Ratio: 2
VLDL: 21 mg/dL (ref 0.0–40.0)

## 2016-06-14 LAB — BASIC METABOLIC PANEL
BUN: 8 mg/dL (ref 6–23)
CO2: 27 mEq/L (ref 19–32)
CREATININE: 0.86 mg/dL (ref 0.40–1.50)
Calcium: 9.5 mg/dL (ref 8.4–10.5)
Chloride: 103 mEq/L (ref 96–112)
GFR: 97.41 mL/min (ref >60.00)
Glucose, Bld: 82 mg/dL (ref 70–99)
Potassium: 4.6 mEq/L (ref 3.5–5.1)
Sodium: 140 mEq/L (ref 135–145)

## 2016-06-14 LAB — URINALYSIS, ROUTINE W REFLEX MICROSCOPIC
BILIRUBIN URINE: NEGATIVE
Ketones, ur: NEGATIVE
Leukocytes, UA: NEGATIVE
Nitrite: NEGATIVE
PH: 5.5 (ref 5.0–8.0)
Total Protein, Urine: NEGATIVE
Urine Glucose: NEGATIVE
Urobilinogen, UA: 0.2 (ref 0.0–1.0)

## 2016-06-14 LAB — HEPATIC FUNCTION PANEL
ALK PHOS: 66 U/L (ref 39–117)
ALT: 20 U/L (ref 0–53)
AST: 19 U/L (ref 0–37)
Albumin: 4.4 g/dL (ref 3.5–5.2)
BILIRUBIN DIRECT: 0.1 mg/dL (ref 0.0–0.3)
BILIRUBIN TOTAL: 0.6 mg/dL (ref 0.2–1.2)
Total Protein: 7.3 g/dL (ref 6.0–8.3)

## 2016-06-14 LAB — FOLATE

## 2016-06-14 LAB — HOMOCYSTEINE: HOMOCYSTEINE: 13 umol/L — AB (ref <11.4)

## 2016-06-14 LAB — HEPATITIS C ANTIBODY: HCV AB: NEGATIVE

## 2016-06-14 NOTE — Progress Notes (Signed)
Pre visit review using our clinic review tool, if applicable. No additional management support is needed unless otherwise documented below in the visit note. 

## 2016-06-14 NOTE — Progress Notes (Signed)
Subjective:    Patient ID: Jason Arnold, male    DOB: [birth year], 55 y.o.   MRN: [id number]  HPI  Jason Arnold is a 55 yr old male who presents today for cpx.  Patient presents today for complete physical.  Immunizations: up to date Diet:reports healthy diet. Does drink 5-6 bourbons a day. Exercise: walks his dog every night, gardens, mows gras Colonoscopy: 7/15- was told 5 yr follow up.   Dental: up to date Vision:  Goes annually to eye doctor. Wt Readings from Last 3 Encounters:  06/14/16 171 lb (77.565 kg)  05/20/15 175 lb 3.2 oz (79.47 kg)  07/17/14 181 lb (82.101 kg)   MTHFR mutation- diagnosed last year at his cpx.  He was placed on folic acid.    Review of Systems  Constitutional: Negative for unexpected weight change.  HENT: Positive for hearing loss.        Sees audiology  Respiratory: Negative for cough and shortness of breath.   Cardiovascular: Negative for chest pain.  Gastrointestinal: Negative for constipation.       Occasional diarrhea/gas  Genitourinary: Negative for dysuria and frequency.  Musculoskeletal: Negative for myalgias.       Has some arthritis left knee, overall improved  Skin: Negative for rash.  Neurological: Negative for headaches.  Hematological: Negative for adenopathy.  Psychiatric/Behavioral:       Denies depression/anxiety   Past Medical History  Diagnosis Date  . 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 Personal history of colonic polyps - adenoma 06/19/2009  . 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 Marital Status: Married    Spouse Name: N/A  . Number of Children: 2  . Years of Education: N/A   Occupational History  .     Social History Main Topics  . Smoking status: Never Smoker   . Smokeless tobacco: Never Used  . Alcohol Use: 21.0 oz/week    42 Standard drinks or equivalent per week     Comment: has about 6 drinks a day  . Drug Use:  No  . Sexual Activity: Not on file   Other Topics Concern  . 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   Enjoys reading, working outside.   Son 39, girl 50   Completed doctoral degree in chemistry   Has a dog    Past Surgical History  Procedure Laterality Date  . Hydrocele excision / repair  1991  . 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 Cancer Mother     history of colon cancer in her 32's  . Colon cancer Mother 47  . Hyperlipidemia Father   . Heart attack Father   . Other Sister     MTHFR C677T mutation    No Known Allergies  Current Outpatient Prescriptions on File Prior to Visit  Medication Sig Dispense Refill  . folic acid (FOLVITE) 1 MG tablet Take 1 tablet (1 mg total) by mouth daily.     No current facility-administered medications on file prior to visit.    BP 130/70 mmHg  Pulse 57  Temp(Src) 97.8 F (36.6 C) (Oral)  Resp 16  Ht 6'  1.5" (1.867 m)  Wt 171 lb (77.565 kg)  BMI 22.25 kg/m2  SpO2 99%       Objective:   Physical Exam Physical Exam  Constitutional: He is oriented to person, place, and time. He appears well-developed and well-nourished. No distress.  HENT:  Head: Normocephalic and atraumatic.  Right Ear: Tympanic membrane and ear canal normal.  Left Ear: Tympanic membrane and ear canal normal.  Mouth/Throat: Oropharynx is clear and moist.  Eyes: Pupils are equal, round, and reactive to light. No scleral icterus.  Neck: Normal range of motion. No thyromegaly present.  Cardiovascular: Normal rate and regular rhythm.   No murmur heard. Pulmonary/Chest: Effort normal and breath sounds normal. No respiratory distress. He has no wheezes. He has no rales. He exhibits no tenderness.  Abdominal: Soft. Bowel sounds are normal. He exhibits no distension and no mass. There is no  tenderness. There is no rebound and no guarding.  Musculoskeletal: He exhibits no edema.  Lymphadenopathy:    He has no cervical adenopathy.  Neurological: He is alert and oriented to person, place, and time. He has normal patellar reflexes. He exhibits normal muscle tone. Coordination normal.  Skin: Skin is warm and dry.  Psychiatric: He has a normal mood and affect. His behavior is normal. Judgment and thought content normal.          Assessment & Plan:          Assessment & Plan:  EKG tracing is personally reviewed.  EKG notes NSR.  No acute changes.

## 2016-06-14 NOTE — Assessment & Plan Note (Signed)
Check b12, folate, homocysteine. Continue folic acid supplement.

## 2016-06-14 NOTE — Patient Instructions (Signed)
Please complete lab work prior to leaving. Try to cut back on alcohol consumption to 2 or less drinks/day. Continue healthy diet and exercise.

## 2016-06-14 NOTE — Assessment & Plan Note (Signed)
Continue healthy diet/exercise. Discussed trying to cut back on ETOH to 2 or less drinks a day. Immunizations, colo up to date.  Obtain routine lab work including PSA.

## 2016-06-15 ENCOUNTER — Encounter: Payer: Self-pay | Admitting: Family

## 2016-06-15 MED ORDER — VITAMIN B-6 250 MG PO TABS: 250.0000 mg | ORAL_TABLET | Freq: Every day | ORAL | Status: DC

## 2017-06-15 ENCOUNTER — Telehealth: Payer: Self-pay | Admitting: Family

## 2017-06-15 ENCOUNTER — Encounter: Payer: Self-pay | Admitting: Family

## 2017-06-15 ENCOUNTER — Ambulatory Visit (INDEPENDENT_AMBULATORY_CARE_PROVIDER_SITE_OTHER): Payer: 59 | Admitting: Family

## 2017-06-15 VITALS — BP 136/88 | HR 58 | Temp 98.1°F | Resp 16 | Ht 74.0 in | Wt 174.0 lb

## 2017-06-15 DIAGNOSIS — Z0000 Encounter for general adult medical examination without abnormal findings: Secondary | ICD-10-CM

## 2017-06-15 LAB — TSH: TSH: 2.51 u[IU]/mL (ref 0.35–4.50)

## 2017-06-15 LAB — CBC WITH DIFFERENTIAL/PLATELET
BASOS PCT: 3.5 % — AB (ref 0.0–3.0)
Basophils Absolute: 0.2 10*3/uL — ABNORMAL HIGH (ref 0.0–0.1)
EOS PCT: 12.9 % — AB (ref 0.0–5.0)
Eosinophils Absolute: 0.6 10*3/uL (ref 0.0–0.7)
HCT: 45.8 % (ref 39.0–52.0)
Hemoglobin: 15.6 g/dL (ref 13.0–17.0)
LYMPHS ABS: 1.7 10*3/uL (ref 0.7–4.0)
Lymphocytes Relative: 39 % (ref 12.0–46.0)
MCHC: 34.1 g/dL (ref 30.0–36.0)
MCV: 101.4 fl — ABNORMAL HIGH (ref 78.0–100.0)
MONOS PCT: 8.4 % (ref 3.0–12.0)
Monocytes Absolute: 0.4 10*3/uL (ref 0.1–1.0)
NEUTROS ABS: 1.6 10*3/uL (ref 1.4–7.7)
NEUTROS PCT: 36.2 % — AB (ref 43.0–77.0)
PLATELETS: 301 10*3/uL (ref 150.0–400.0)
RBC: 4.51 Mil/uL (ref 4.22–5.81)
RDW: 13.1 % (ref 11.5–15.5)
WBC: 4.4 10*3/uL (ref 4.0–10.5)

## 2017-06-15 LAB — URINALYSIS, ROUTINE W REFLEX MICROSCOPIC
Bilirubin Urine: NEGATIVE
KETONES UR: NEGATIVE
LEUKOCYTES UA: NEGATIVE
NITRITE: NEGATIVE
Specific Gravity, Urine: 1.015 (ref 1.000–1.030)
Total Protein, Urine: NEGATIVE
URINE GLUCOSE: NEGATIVE
Urobilinogen, UA: 0.2 (ref 0.0–1.0)
WBC, UA: NONE SEEN (ref 0–?)
pH: 6 (ref 5.0–8.0)

## 2017-06-15 LAB — LIPID PANEL
CHOLESTEROL: 190 mg/dL (ref 0–200)
HDL: 87.3 mg/dL (ref >39.00)
LDL Cholesterol: 85 mg/dL (ref 0–99)
NonHDL: 102.91
TRIGLYCERIDES: 92 mg/dL (ref 0.0–149.0)
Total CHOL/HDL Ratio: 2
VLDL: 18.4 mg/dL (ref 0.0–40.0)

## 2017-06-15 LAB — BASIC METABOLIC PANEL
BUN: 10 mg/dL (ref 6–23)
CO2: 28 meq/L (ref 19–32)
Calcium: 9.7 mg/dL (ref 8.4–10.5)
Chloride: 102 mEq/L (ref 96–112)
Creatinine, Ser: 0.84 mg/dL (ref 0.40–1.50)
GFR: 99.74 mL/min (ref >60.00)
GLUCOSE: 84 mg/dL (ref 70–99)
POTASSIUM: 4.6 meq/L (ref 3.5–5.1)
SODIUM: 138 meq/L (ref 135–145)

## 2017-06-15 LAB — HEPATIC FUNCTION PANEL
ALBUMIN: 4.6 g/dL (ref 3.5–5.2)
ALT: 24 U/L (ref 0–53)
AST: 21 U/L (ref 0–37)
Alkaline Phosphatase: 55 U/L (ref 39–117)
Bilirubin, Direct: 0.1 mg/dL (ref 0.0–0.3)
Total Bilirubin: 0.6 mg/dL (ref 0.2–1.2)
Total Protein: 7.4 g/dL (ref 6.0–8.3)

## 2017-06-15 MED ORDER — FOLIC ACID 400 MCG PO TABS: 400.0000 ug | ORAL_TABLET | Freq: Every day | ORAL | Status: DC

## 2017-06-15 NOTE — Telephone Encounter (Signed)
  Spoke with pt. He states he has "had blood in his urine for years". Reports "scope on his bladder" by urologist > 10 yrs ago and everything was ok. Please advise?   Debbrah Alar, NP  Luberta Mutter Consuello Bossier, CMA        Labs look good, except he has some microscopic blood in his urine. I would like him to repeat UA with micro in 2 weeks dx microscopic hematuria.

## 2017-06-15 NOTE — Telephone Encounter (Signed)
Notified pt and he voices understanding. 

## 2017-06-15 NOTE — Patient Instructions (Addendum)
Please complete lab work prior to leaving. Try to limit your alcohol to 2 or less/day. Please contact your insurance to check coverage for the Shingrix vaccine to protect you against shingles. This is a 2 dose series.  One shot followed by a second shot 2-6 months later.  If this vaccine is covered by your insurance, please contact our office to schedule a nurse visit and we will be happy to give it to you.

## 2017-06-15 NOTE — Telephone Encounter (Signed)
I reviewed the b vitamin that he is taking. It does not have quite enough folic acid.  I would recommend that he add an additional 357SVX tablet of folic acid.

## 2017-06-15 NOTE — Progress Notes (Addendum)
Subjective:    Patient ID: Jason Arnold, male    DOB: 02/20/1961, 56 y.o.   MRN: [id number]  HPI   Patient presents today for complete physical.  Immunizations: tetanus up to date, shingles due Diet: reports diet is healthy Exercise: active in yard, walking Colonoscopy: 2015, due 2020 Dental: up to date Vision: has an appointment  Wt Readings from Last 3 Encounters:  06/15/17 174 lb (78.9 kg)  06/14/16 171 lb (77.6 kg)  05/20/15 175 lb 3.2 oz (79.5 kg)      Review of Systems  Constitutional: Negative for unexpected weight change.  HENT: Negative for rhinorrhea.        + hearing loss- has seen audiology  Eyes: Negative for visual disturbance.  Respiratory: Negative for cough and shortness of breath.   Cardiovascular: Negative for chest pain and leg swelling.  Gastrointestinal: Negative for blood in stool, constipation and diarrhea.  Genitourinary: Negative for frequency and hematuria.  Musculoskeletal: Negative for arthralgias and myalgias.  Neurological: Negative for headaches.  Hematological: Negative for adenopathy.  Psychiatric/Behavioral:       Denies depression/anxiety symptoms   Past Medical History:  Diagnosis Date  . 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 Personal history of colonic polyps - adenoma 06/19/2009     Social History   Social History  . Marital status: Married    Spouse name: N/A  . Number of children: 2  . Years of education: N/A   Occupational History  .  [employer]   Social History Main Topics  . Smoking status: Never Smoker  . Smokeless tobacco: Never Used  . Alcohol use 21.0 oz/week    42 Standard drinks or equivalent per week     Comment: has about 6 drinks a day  . Drug use: No  . Sexual activity: Not on file   Other Topics Concern  . 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   Enjoys reading, working outside.   Son 47, girl 49   Completed doctoral degree in chemistry   Has a dog    Past Surgical History:  Procedure Laterality Date  . 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 HYDROCELE EXCISION / REPAIR  1991    Family History  Problem Relation Age of Onset  . Cancer Mother        history of colon cancer in her 45's  . Colon cancer Mother 67  . Hyperlipidemia Father   . Heart attack Father 12       deceased  . Other Sister        MTHFR C677T mutation    No Known Allergies  Current Outpatient Prescriptions on File Prior to Visit  Medication Sig Dispense Refill  . OVER THE COUNTER MEDICATION BE ACTIVE. Take 1 capsule daily.    . Pyridoxine HCl (VITAMIN B-6) 250 MG tablet Take 1 tablet (250 mg total) by mouth daily.     No current facility-administered medications on file prior to visit.     BP 136/88 (BP Location: Right Arm, Cuff Size: Normal)   Pulse (!) 58   Temp 98.1 F (36.7 C) (Oral)   Resp 16   Ht 6\' 2"  (1.88 m)   Wt 174 lb (  78.9 kg)   SpO2 100%   BMI 22.34 kg/m        Objective:   Physical Exam Physical Exam  Constitutional: He is oriented to person, place, and time. He appears well-developed and well-nourished. No distress.  HENT:  Head: Normocephalic and atraumatic.  Right Ear: Tympanic membrane and ear canal normal.  Left Ear: Tympanic membrane and ear canal normal.  Mouth/Throat: Oropharynx is clear and moist.  Eyes: Pupils are equal, round, and reactive to light. No scleral icterus.  Neck: Normal range of motion. No thyromegaly present.  Cardiovascular: Normal rate and regular rhythm.   No murmur heard. Pulmonary/Chest: Effort normal and breath sounds normal. No respiratory distress. He has no wheezes. He has no rales. He exhibits no tenderness.  Abdominal: Soft. Bowel sounds are normal. He exhibits no distension and no mass. There is no tenderness.  There is no rebound and no guarding.  Musculoskeletal: He exhibits no edema.  Lymphadenopathy:    He has no cervical adenopathy.  Neurological: He is alert and oriented to person, place, and time. He has normal patellar reflexes. He exhibits normal muscle tone. Coordination normal.  Skin: Skin is warm and dry.  Psychiatric: He has a normal mood and affect. His behavior is normal. Judgment and thought content normal.           Assessment & Plan:   Preventative care- due for shingrix- I have asked him to check coverage with his insurance. Discussed cutting back alcohol from 6 drinks to 2 or less/day. Colonoscopy is up to date.  EKG tracing is personally reviewed.  EKG notes NSR.  No acute changes.        Assessment & Plan:

## 2017-06-16 ENCOUNTER — Encounter: Payer: Self-pay | Admitting: Family

## 2017-06-16 DIAGNOSIS — R3129 Other microscopic hematuria: Secondary | ICD-10-CM | POA: Insufficient documentation

## 2017-09-09 DIAGNOSIS — Z23 Encounter for immunization: Secondary | ICD-10-CM | POA: Diagnosis not present

## 2017-10-05 DIAGNOSIS — D485 Neoplasm of uncertain behavior of skin: Secondary | ICD-10-CM | POA: Diagnosis not present

## 2017-10-05 DIAGNOSIS — L821 Other seborrheic keratosis: Secondary | ICD-10-CM | POA: Diagnosis not present

## 2017-10-05 DIAGNOSIS — L814 Other melanin hyperpigmentation: Secondary | ICD-10-CM | POA: Diagnosis not present

## 2017-10-05 DIAGNOSIS — D1801 Hemangioma of skin and subcutaneous tissue: Secondary | ICD-10-CM | POA: Diagnosis not present

## 2017-10-05 DIAGNOSIS — D225 Melanocytic nevi of trunk: Secondary | ICD-10-CM | POA: Diagnosis not present

## 2018-06-21 ENCOUNTER — Other Ambulatory Visit (INDEPENDENT_AMBULATORY_CARE_PROVIDER_SITE_OTHER): Payer: 59

## 2018-06-21 ENCOUNTER — Ambulatory Visit (INDEPENDENT_AMBULATORY_CARE_PROVIDER_SITE_OTHER): Payer: 59 | Admitting: Family

## 2018-06-21 ENCOUNTER — Telehealth: Payer: Self-pay | Admitting: Family

## 2018-06-21 ENCOUNTER — Encounter: Payer: Self-pay | Admitting: Family

## 2018-06-21 VITALS — BP 143/91 | HR 63 | Temp 98.0°F | Resp 16 | Ht 74.0 in | Wt 177.0 lb

## 2018-06-21 DIAGNOSIS — D7589 Other specified diseases of blood and blood-forming organs: Secondary | ICD-10-CM | POA: Diagnosis not present

## 2018-06-21 DIAGNOSIS — Z0000 Encounter for general adult medical examination without abnormal findings: Secondary | ICD-10-CM

## 2018-06-21 DIAGNOSIS — E875 Hyperkalemia: Secondary | ICD-10-CM

## 2018-06-21 LAB — URINALYSIS, ROUTINE W REFLEX MICROSCOPIC
BILIRUBIN URINE: NEGATIVE
KETONES UR: NEGATIVE
Leukocytes, UA: NEGATIVE
Nitrite: NEGATIVE
PH: 7 (ref 5.0–8.0)
Specific Gravity, Urine: 1.01 (ref 1.000–1.030)
Total Protein, Urine: NEGATIVE
UROBILINOGEN UA: 0.2 (ref 0.0–1.0)
Urine Glucose: NEGATIVE

## 2018-06-21 LAB — BASIC METABOLIC PANEL
BUN: 9 mg/dL (ref 6–23)
CHLORIDE: 102 meq/L (ref 96–112)
CO2: 32 meq/L (ref 19–32)
CREATININE: 0.86 mg/dL (ref 0.40–1.50)
Calcium: 10.1 mg/dL (ref 8.4–10.5)
GFR: 96.72 mL/min (ref >60.00)
GLUCOSE: 94 mg/dL (ref 70–99)
Potassium: 5.9 mEq/L — ABNORMAL HIGH (ref 3.5–5.1)
Sodium: 141 mEq/L (ref 135–145)

## 2018-06-21 LAB — CBC WITH DIFFERENTIAL/PLATELET
BASOS PCT: 2.6 % (ref 0.0–3.0)
Basophils Absolute: 0.1 10*3/uL (ref 0.0–0.1)
EOS ABS: 0.5 10*3/uL (ref 0.0–0.7)
Eosinophils Relative: 9.3 % — ABNORMAL HIGH (ref 0.0–5.0)
HEMATOCRIT: 48.3 % (ref 39.0–52.0)
Hemoglobin: 16.3 g/dL (ref 13.0–17.0)
LYMPHS ABS: 1.7 10*3/uL (ref 0.7–4.0)
Lymphocytes Relative: 35 % (ref 12.0–46.0)
MCHC: 33.7 g/dL (ref 30.0–36.0)
MCV: 102.1 fl — ABNORMAL HIGH (ref 78.0–100.0)
MONO ABS: 0.5 10*3/uL (ref 0.1–1.0)
Monocytes Relative: 10 % (ref 3.0–12.0)
NEUTROS ABS: 2.1 10*3/uL (ref 1.4–7.7)
NEUTROS PCT: 43.1 % (ref 43.0–77.0)
PLATELETS: 313 10*3/uL (ref 150.0–400.0)
RBC: 4.73 Mil/uL (ref 4.22–5.81)
RDW: 12.9 % (ref 11.5–15.5)
WBC: 4.8 10*3/uL (ref 4.0–10.5)

## 2018-06-21 LAB — HEPATIC FUNCTION PANEL
ALT: 20 U/L (ref 0–53)
AST: 16 U/L (ref 0–37)
Albumin: 4.6 g/dL (ref 3.5–5.2)
Alkaline Phosphatase: 65 U/L (ref 39–117)
BILIRUBIN DIRECT: 0.2 mg/dL (ref 0.0–0.3)
BILIRUBIN TOTAL: 0.8 mg/dL (ref 0.2–1.2)
TOTAL PROTEIN: 7.6 g/dL (ref 6.0–8.3)

## 2018-06-21 LAB — LIPID PANEL
CHOL/HDL RATIO: 2
Cholesterol: 203 mg/dL — ABNORMAL HIGH (ref 0–200)
HDL: 90.1 mg/dL (ref >39.00)
LDL Cholesterol: 85 mg/dL (ref 0–99)
NONHDL: 112.84
TRIGLYCERIDES: 137 mg/dL (ref 0.0–149.0)
VLDL: 27.4 mg/dL (ref 0.0–40.0)

## 2018-06-21 LAB — TSH: TSH: 3.42 u[IU]/mL (ref 0.35–4.50)

## 2018-06-21 NOTE — Telephone Encounter (Signed)
OK FOR PEC / TRIAGE TO DISCUSS WITH PT.   Attempted to reach pt and gave details to pt's wife. She states pt will have to call back to schedule lab appt as she doesn't have his work schedule. Add on form was faxed to the lab to see if B12 and folate can be added. IF not, will do when he returns to check potassium. Future lab order has been entered. He needs to schedule lab appt this week to recheck potassium.

## 2018-06-21 NOTE — Telephone Encounter (Signed)
Please contact patient and let him know that his potassium came back quite elevated.  I would like for him to repeat this test at his earliest convenience please.  Diagnosis hyperkalemia.  Cholesterol is mildly elevated.  He should continue to work on [employer]. Also, can lab add on b12 and folate dx macrocytosis please.  His red blood cells were noted to be a bit large and this can sometimes indicate b12 or folate deficiency.

## 2018-06-21 NOTE — Progress Notes (Signed)
Subjective:    Patient ID: Jason Arnold, male    DOB: 27-Aug-1961, 57 y.o.   MRN: [id number]  HPI  Patient presents today for complete physical.  Immunizations: tdap 2014 Diet:reports diet is healthy Exercise: active outside, not a "gym" person Colonoscopy: due in 1 year Dental: up to date Vision:  1 year ago Wt Readings from Last 3 Encounters:  06/21/18 177 lb (80.3 kg)  06/15/17 174 lb (78.9 kg)  06/14/16 171 lb (77.6 kg)       Review of Systems  Constitutional: Negative for unexpected weight change.  HENT: Positive for hearing loss. Negative for rhinorrhea.   Eyes: Negative for visual disturbance.  Respiratory: Negative for cough and shortness of breath.   Cardiovascular: Negative for chest pain.  Gastrointestinal: Negative for diarrhea, nausea and vomiting.  Genitourinary: Negative for frequency.  Musculoskeletal: Negative for arthralgias and myalgias.  Skin: Negative for rash.  Neurological: Negative for headaches.  Hematological: Negative for adenopathy.  Psychiatric/Behavioral:       Denies depression/anxiety       Past Medical History:  Diagnosis Date  . 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 Microscopic hematuria   . Personal history of colonic polyps - adenoma 06/19/2009     Social History   Socioeconomic History  . Marital status: Married    Spouse name: Not on file  . Number of children: 2  . Years of education: Not on file  . Highest education level: Not on file  Occupational History    Employer: [employer]  Social Needs  . Financial resource strain: Not on file  . Food insecurity:    Worry: Not on file    Inability: Not on file  . Transportation needs:    Medical: Not on file    Non-medical: Not on file  Tobacco Use  . Smoking status: Never Smoker  . Smokeless tobacco: Never Used  Substance and Sexual Activity  . Alcohol use: Yes   Alcohol/week: 25.2 oz    Types: 42 Standard drinks or equivalent per week    Comment: has about 5 drinks a day  . Drug use: No  . Sexual activity: Not on file  Lifestyle  . Physical activity:    Days per week: Not on file    Minutes per session: Not on file  . Stress: Not on file  Relationships  . Social connections:    Talks on phone: Not on file    Gets together: Not on file    Attends religious service: Not on file    Active member of club or organization: Not on file    Attends meetings of clubs or organizations: Not on file    Relationship status: Not on file  . Intimate partner violence:    Fear of current or ex partner: Not on file    Emotionally abused: Not on file    Physically abused: Not on file    Forced sexual activity: Not on file  Other Topics Concern  . 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   Enjoys reading, working outside.   Son born 2002   Daughter- 1997   Completed doctoral degree in chemistry   Has a dog    Past Surgical History:  Procedure Laterality Date  . 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 HYDROCELE EXCISION / REPAIR  1991    Family History  Problem Relation Age of Onset  . Cancer Mother        history of colon cancer in her 45's  . Colon cancer Mother 27  . Hyperlipidemia Father   . Heart attack Father 29       deceased  . Other Sister        MTHFR C677T mutation    No Known Allergies  Current Outpatient Medications on File Prior to Visit  Medication Sig Dispense Refill  . Levomefolic Acid (5-MTHF PO) Take 1 capsule by mouth daily.    Marland Kitchen OVER THE COUNTER MEDICATION BE ACTIVE. Take 1 capsule daily.     No current facility-administered medications on file prior to visit.     BP (!) 143/91 (BP Location: Right Arm, Cuff Size: Normal)   Pulse 63   Temp 98 F (36.7 C) (Oral)   Resp 16   Ht 6\' 2"  (1.88 m)   Wt 177  lb (80.3 kg)   SpO2 98%   BMI 22.73 kg/m    Objective:   Physical Exam        Assessment & Plan:  Preventative care- encouraged pt to continue healthy diet and regular exercise. Will obtain routine lab work.  Tetanus up to date. Candidate for shingrix which is on national backorder. EKG tracing is personally reviewed.  EKG notes NSR.  No acute changes.   Elevated blood pressure reading- discussed low sodium diet. Plan repeat blood pressure in 3 months.  BP Readings from Last 3 Encounters:  06/21/18 (!) 143/91  06/15/17 136/88  06/14/16 130/70

## 2018-06-21 NOTE — Patient Instructions (Addendum)
Please complete lab work prior to leaving.  Work on low sodium diet.

## 2018-06-22 LAB — VITAMIN B12: VITAMIN B 12: 650 pg/mL (ref 211–911)

## 2018-06-22 LAB — FOLATE

## 2018-06-23 ENCOUNTER — Other Ambulatory Visit (INDEPENDENT_AMBULATORY_CARE_PROVIDER_SITE_OTHER): Payer: 59

## 2018-06-23 DIAGNOSIS — E875 Hyperkalemia: Secondary | ICD-10-CM

## 2018-06-23 LAB — BASIC METABOLIC PANEL
BUN: 13 mg/dL (ref 7–25)
CHLORIDE: 102 mmol/L (ref 98–110)
CO2: 27 mmol/L (ref 20–32)
Calcium: 9.5 mg/dL (ref 8.6–10.3)
Creat: 0.97 mg/dL (ref 0.70–1.33)
Glucose, Bld: 82 mg/dL (ref 65–99)
Potassium: 5 mmol/L (ref 3.5–5.3)
SODIUM: 138 mmol/L (ref 135–146)

## 2018-06-23 NOTE — Telephone Encounter (Signed)
Spoke with pt and scheduled lab appt for today at 3:45pm for bmet. Remaining results from 06/20/18 have been released to pt via mychart.

## 2018-06-23 NOTE — Addendum Note (Signed)
Addended by: Caffie Pinto on: 06/23/2018 12:17 PM   Modules accepted: Orders

## 2018-09-21 ENCOUNTER — Ambulatory Visit: Payer: 59

## 2018-09-22 ENCOUNTER — Ambulatory Visit: Payer: 59

## 2018-10-02 DIAGNOSIS — Z23 Encounter for immunization: Secondary | ICD-10-CM | POA: Diagnosis not present

## 2019-06-21 ENCOUNTER — Encounter: Payer: Self-pay | Admitting: Internal Medicine

## 2019-06-25 ENCOUNTER — Encounter: Payer: 59 | Admitting: Family

## 2019-07-03 ENCOUNTER — Encounter: Payer: Self-pay | Admitting: Family

## 2019-07-03 ENCOUNTER — Other Ambulatory Visit: Payer: Self-pay

## 2019-07-03 ENCOUNTER — Ambulatory Visit (INDEPENDENT_AMBULATORY_CARE_PROVIDER_SITE_OTHER): Payer: BC Managed Care – PPO | Admitting: Family

## 2019-07-03 VITALS — BP 151/91 | HR 61 | Temp 98.3°F | Resp 16 | Ht 74.0 in | Wt 164.0 lb

## 2019-07-03 DIAGNOSIS — Z23 Encounter for immunization: Secondary | ICD-10-CM

## 2019-07-03 DIAGNOSIS — Z0000 Encounter for general adult medical examination without abnormal findings: Secondary | ICD-10-CM

## 2019-07-03 NOTE — Patient Instructions (Signed)
Please complete lab work prior to leaving.   

## 2019-07-03 NOTE — Progress Notes (Signed)
Subjective:    Patient ID: Jason Arnold, male    DOB: 16-Mar-1961, 58 y.o.   MRN: [id number]  HPI  Patient presents today for complete physical.  Immunizations: tdap 2014, would like shingrix Diet: healthy Exercise: reports that he walks 8-12 miles a day Wt Readings from Last 3 Encounters:  07/03/19 164 lb (74.4 kg)  06/21/18 177 lb (80.3 kg)  06/15/17 174 lb (78.9 kg)  Colonoscopy:  Due- pt will schedule Dental: up to date Vision: up to date  Reports bp at home is better. Tests every day and bp is much better at home.   BP Readings from Last 3 Encounters:  07/03/19 (!) 151/91  06/21/18 (!) 143/91  06/15/17 136/88     Review of Systems  Constitutional: Negative for unexpected weight change.  HENT: Positive for hearing loss (reports that he has had testing in the past and declines repeat testing at this time. ). Negative for rhinorrhea.   Eyes: Negative for visual disturbance.  Respiratory: Negative for cough and shortness of breath.   Cardiovascular: Negative for chest pain and leg swelling.  Gastrointestinal: Negative for blood in stool, constipation and diarrhea.  Genitourinary: Negative for dysuria and frequency.  Musculoskeletal: Negative for arthralgias and myalgias.  Skin: Negative for rash.  Neurological: Negative for headaches.  Hematological: Negative for adenopathy.  Psychiatric/Behavioral:       Denies depression/anxiety       Past Medical History:  Diagnosis Date  . 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 Microscopic hematuria   . Personal history of colonic polyps - adenoma 06/19/2009     Social History   Socioeconomic History  . Marital status: Married    Spouse name: Not on file  . Number of children: 2  . Years of education: Not on file  . Highest education level: Not on file  Occupational History    Employer: [employer]  Social Needs  .  Financial resource strain: Not on file  . Food insecurity    Worry: Not on file    Inability: Not on file  . Transportation needs    Medical: Not on file    Non-medical: Not on file  Tobacco Use  . Smoking status: Never Smoker  . Smokeless tobacco: Never Used  Substance and Sexual Activity  . Alcohol use: Yes    Alcohol/week: 42.0 standard drinks    Types: 42 Standard drinks or equivalent per week    Comment: has about 5 drinks a day  . Drug use: No  . Sexual activity: Not on file  Lifestyle  . Physical activity    Days per week: Not on file    Minutes per session: Not on file  . Stress: Not on file  Relationships  . Social Herbalist on phone: Not on file    Gets together: Not on file    Attends religious service: Not on file    Active member of club or organization: Not on file    Attends meetings of clubs or organizations: Not on file    Relationship status: Not on file  . Intimate partner violence    Fear of current or ex partner: Not on file    Emotionally abused: Not on file    Physically abused: Not on file    Forced sexual activity: Not on file  Other Topics Concern  .  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   Enjoys reading, working outside.   Son born 2002   Daughter- 1997   Completed doctoral degree in chemistry   Has a dog    Past Surgical History:  Procedure Laterality Date  . 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 HYDROCELE EXCISION / REPAIR  1991    Family History  Problem Relation Age of Onset  . Cancer Mother        history of colon cancer in her 32's  . Colon cancer Mother 96  . Hyperlipidemia Father   . Heart attack Father 44       deceased  . Other Sister        MTHFR C677T mutation    No Known Allergies  Current Outpatient Medications on File Prior to Visit  Medication Sig Dispense Refill  . Levomefolic Acid  (5-MTHF PO) Take 1 capsule by mouth daily.    Marland Kitchen VITAMIN B COMPLEX-C PO Take by mouth.     No current facility-administered medications on file prior to visit.     BP (!) 151/91 (BP Location: Right Arm, Patient Position: Sitting, Cuff Size: Small)   Pulse 61   Temp 98.3 F (36.8 C) (Oral)   Resp 16   Ht 6\' 2"  (1.88 m)   Wt 164 lb (74.4 kg)   SpO2 100%   BMI 21.06 kg/m    Objective:   Physical Exam  Physical Exam  Constitutional: He is oriented to person, place, and time. He appears well-developed and well-nourished. No distress.  HENT:  Head: Normocephalic and atraumatic.  Right Ear: Tympanic membrane and ear canal normal.  Left Ear: Tympanic membrane and ear canal normal.  Mouth/Throat: not examined, wearing mask due to covid precautions Eyes: Pupils are equal, round, and reactive to light. No scleral icterus.  Neck: Normal range of motion. No thyromegaly present.  Cardiovascular: Normal rate and regular rhythm.   No murmur heard. Pulmonary/Chest: Effort normal and breath sounds normal. No respiratory distress. He has no wheezes. He has no rales. He exhibits no tenderness.  Abdominal: Soft. Bowel sounds are normal. He exhibits no distension and no mass. There is no tenderness. There is no rebound and no guarding.  Musculoskeletal: He exhibits no edema.  Lymphadenopathy:    He has no cervical adenopathy.  Neurological: He is alert and oriented to person, place, and time. He has normal patellar reflexes. He exhibits normal muscle tone. Coordination normal.  Skin: Skin is warm and dry.  Psychiatric: He has a normal mood and affect. His behavior is normal. Judgment and thought content normal.           Assessment & Plan:   Preventative care- Encouraged patient to continue healthy diet and exercise. He will schedule his colonoscopy.  Obtain routine lab work. Shingrix #1 today  Elevated blood pressure- ? White coat hypertension. Advised pt to follow up in 1 month for  repeat bp and to bring his home machine with him for comparison.        Assessment & Plan:

## 2019-07-04 LAB — CBC WITH DIFFERENTIAL/PLATELET
Basophils Absolute: 0.1 10*3/uL (ref 0.0–0.1)
Basophils Relative: 1.4 % (ref 0.0–3.0)
Eosinophils Absolute: 0.4 10*3/uL (ref 0.0–0.7)
Eosinophils Relative: 4.8 % (ref 0.0–5.0)
HCT: 43.3 % (ref 39.0–52.0)
Hemoglobin: 14.6 g/dL (ref 13.0–17.0)
Lymphocytes Relative: 25.9 % (ref 12.0–46.0)
Lymphs Abs: 1.9 10*3/uL (ref 0.7–4.0)
MCHC: 33.7 g/dL (ref 30.0–36.0)
MCV: 102.4 fl — ABNORMAL HIGH (ref 78.0–100.0)
Monocytes Absolute: 0.7 10*3/uL (ref 0.1–1.0)
Monocytes Relative: 8.8 % (ref 3.0–12.0)
Neutro Abs: 4.4 10*3/uL (ref 1.4–7.7)
Neutrophils Relative %: 59.1 % (ref 43.0–77.0)
Platelets: 307 10*3/uL (ref 150.0–400.0)
RBC: 4.23 Mil/uL (ref 4.22–5.81)
RDW: 12.5 % (ref 11.5–15.5)
WBC: 7.5 10*3/uL (ref 4.0–10.5)

## 2019-07-04 LAB — HEPATIC FUNCTION PANEL
ALT: 27 U/L (ref 0–53)
AST: 23 U/L (ref 0–37)
Albumin: 4.7 g/dL (ref 3.5–5.2)
Alkaline Phosphatase: 61 U/L (ref 39–117)
Bilirubin, Direct: 0.1 mg/dL (ref 0.0–0.3)
Total Bilirubin: 0.6 mg/dL (ref 0.2–1.2)
Total Protein: 7.7 g/dL (ref 6.0–8.3)

## 2019-07-04 LAB — LIPID PANEL
Cholesterol: 192 mg/dL (ref 0–200)
HDL: 92.6 mg/dL (ref >39.00)
LDL Cholesterol: 72 mg/dL (ref 0–99)
NonHDL: 99.5
Total CHOL/HDL Ratio: 2
Triglycerides: 137 mg/dL (ref 0.0–149.0)
VLDL: 27.4 mg/dL (ref 0.0–40.0)

## 2019-07-04 LAB — BASIC METABOLIC PANEL
BUN: 19 mg/dL (ref 6–23)
CO2: 27 mEq/L (ref 19–32)
Calcium: 9.4 mg/dL (ref 8.4–10.5)
Chloride: 103 mEq/L (ref 96–112)
Creatinine, Ser: 0.97 mg/dL (ref 0.40–1.50)
GFR: 78.92 mL/min (ref >60.00)
Glucose, Bld: 92 mg/dL (ref 70–99)
Potassium: 4.7 mEq/L (ref 3.5–5.1)
Sodium: 139 mEq/L (ref 135–145)

## 2019-07-04 LAB — TSH: TSH: 2.18 u[IU]/mL (ref 0.35–4.50)

## 2019-09-05 ENCOUNTER — Other Ambulatory Visit: Payer: Self-pay

## 2019-09-05 ENCOUNTER — Ambulatory Visit (INDEPENDENT_AMBULATORY_CARE_PROVIDER_SITE_OTHER): Payer: BC Managed Care – PPO | Admitting: Family

## 2019-09-05 DIAGNOSIS — Z23 Encounter for immunization: Secondary | ICD-10-CM

## 2019-09-05 DIAGNOSIS — R030 Elevated blood-pressure reading, without diagnosis of hypertension: Secondary | ICD-10-CM | POA: Diagnosis not present

## 2019-09-05 NOTE — Progress Notes (Signed)
Pt here for Blood pressure check per Melissa O'sullivan  Pt currently takes: no medication for bp however at his last appointment he was advised to follow up and compare home reading due to elevated reading in office.   BP today @ =154/93 HR =72   BP Readings from Last 3 Encounters:  07/03/19 (!) 151/91  06/21/18 (!) 143/91  06/15/17 136/88    He brought his home machine , on home machine bp reading was  BP=160/108 HR= 74   Pt advised per Debbrah Alar check one daily at home for next 3 days and call us with readings.   Patient also received second shingrix vaccine. 0.42mL shingrix given in left deltoid IM. Patient tolerated well.

## 2019-09-05 NOTE — Progress Notes (Signed)
Pt told CMA that his home readings "are all normal". Need to see home readings and if above goal will need to add medication for BP.  Nance Pear NP

## 2019-09-06 ENCOUNTER — Encounter: Payer: Self-pay | Admitting: Family

## 2019-09-06 DIAGNOSIS — L821 Other seborrheic keratosis: Secondary | ICD-10-CM | POA: Diagnosis not present

## 2019-09-06 DIAGNOSIS — L814 Other melanin hyperpigmentation: Secondary | ICD-10-CM | POA: Diagnosis not present

## 2019-09-06 DIAGNOSIS — D225 Melanocytic nevi of trunk: Secondary | ICD-10-CM | POA: Diagnosis not present

## 2019-09-06 DIAGNOSIS — D485 Neoplasm of uncertain behavior of skin: Secondary | ICD-10-CM | POA: Diagnosis not present

## 2019-09-06 DIAGNOSIS — D2271 Melanocytic nevi of right lower limb, including hip: Secondary | ICD-10-CM | POA: Diagnosis not present

## 2019-09-25 ENCOUNTER — Encounter: Payer: Self-pay | Admitting: Internal Medicine

## 2019-10-18 ENCOUNTER — Ambulatory Visit (AMBULATORY_SURGERY_CENTER): Payer: Self-pay

## 2019-10-18 ENCOUNTER — Other Ambulatory Visit: Payer: Self-pay

## 2019-10-18 ENCOUNTER — Encounter: Payer: Self-pay | Admitting: Internal Medicine

## 2019-10-18 VITALS — Temp 96.6°F | Ht 74.0 in | Wt 167.0 lb

## 2019-10-18 DIAGNOSIS — Z800 Family history of malignant neoplasm of digestive organs: Secondary | ICD-10-CM

## 2019-10-18 DIAGNOSIS — Z86010 Personal history of colonic polyps: Secondary | ICD-10-CM

## 2019-10-18 NOTE — Progress Notes (Signed)
Denies allergies to eggs or soy products. Denies complication of anesthesia or sedation. Denies use of weight loss medication. Denies use of O2.   Emmi instructions given for colonoscopy.  Patient is scheduled for Covid Screening on 10/29/19 @ 1:00 Pm. Patient verbalizes understanding.

## 2019-10-29 ENCOUNTER — Other Ambulatory Visit: Payer: Self-pay | Admitting: Internal Medicine

## 2019-10-29 DIAGNOSIS — Z1159 Encounter for screening for other viral diseases: Secondary | ICD-10-CM | POA: Diagnosis not present

## 2019-10-30 LAB — SARS CORONAVIRUS 2 (TAT 6-24 HRS): SARS Coronavirus 2: NEGATIVE

## 2019-11-01 ENCOUNTER — Encounter: Payer: Self-pay | Admitting: Internal Medicine

## 2019-11-01 ENCOUNTER — Other Ambulatory Visit: Payer: Self-pay

## 2019-11-01 ENCOUNTER — Ambulatory Visit (AMBULATORY_SURGERY_CENTER): Payer: BC Managed Care – PPO | Admitting: Internal Medicine

## 2019-11-01 VITALS — BP 113/71 | HR 67 | Temp 98.1°F | Resp 11 | Ht 74.0 in | Wt 167.0 lb

## 2019-11-01 DIAGNOSIS — D123 Benign neoplasm of transverse colon: Secondary | ICD-10-CM

## 2019-11-01 DIAGNOSIS — D125 Benign neoplasm of sigmoid colon: Secondary | ICD-10-CM

## 2019-11-01 DIAGNOSIS — D3A8 Other benign neuroendocrine tumors: Secondary | ICD-10-CM | POA: Diagnosis not present

## 2019-11-01 DIAGNOSIS — Z86010 Personal history of colonic polyps: Secondary | ICD-10-CM

## 2019-11-01 DIAGNOSIS — Z1211 Encounter for screening for malignant neoplasm of colon: Secondary | ICD-10-CM | POA: Diagnosis not present

## 2019-11-01 MED ORDER — SODIUM CHLORIDE 0.9 % IV SOLN: 500.0000 mL | Freq: Once | INTRAVENOUS | Status: DC

## 2019-11-01 NOTE — Patient Instructions (Addendum)
I found and removed 2 tiny polyps - no signs of cancer I will let you know pathology results and when to have another routine colonoscopy by mail and/or My Chart. Guidelines were updated this year and you may be able to wait to repeat in 7 years.  You also have a condition called diverticulosis - common and not usually a problem. Please read the handout provided.  I appreciate the opportunity to care for you. Gatha Mayer, MD, FACG   YOU HAD AN ENDOSCOPIC PROCEDURE TODAY AT [facility]:   Refer to the procedure report that was given to you for any specific questions about what was found during the examination.  If the procedure report does not answer your questions, please call your gastroenterologist to clarify.  If you requested that your care partner not be given the details of your procedure findings, then the procedure report has been included in a sealed envelope for you to review at your convenience later.  YOU SHOULD EXPECT: Some feelings of bloating in the abdomen. Passage of more gas than usual.  Walking can help get rid of the air that was put into your GI tract during the procedure and reduce the bloating. If you had a lower endoscopy (such as a colonoscopy or flexible sigmoidoscopy) you may notice spotting of blood in your stool or on the toilet paper. If you underwent a bowel prep for your procedure, you may not have a normal bowel movement for a few days.  Please Note:  You might notice some irritation and congestion in your nose or some drainage.  This is from the oxygen used during your procedure.  There is no need for concern and it should clear up in a day or so.  SYMPTOMS TO REPORT IMMEDIATELY:   Following lower endoscopy (colonoscopy or flexible sigmoidoscopy):  Excessive amounts of blood in the stool  Significant tenderness or worsening of abdominal pains  Swelling of the abdomen that is new, acute  Fever of 100F or higher  For urgent or emergent  issues, a gastroenterologist can be reached at any hour by calling [phone].   DIET:  We do recommend a small meal at first, but then you may proceed to your regular diet.  Drink plenty of fluids but you should avoid alcoholic beverages for 24 hours.  ACTIVITY:  You should plan to take it easy for the rest of today and you should NOT DRIVE or use heavy machinery until tomorrow (because of the sedation medicines used during the test).    FOLLOW UP: Our staff will call the number listed on your records 48-72 hours following your procedure to check on you and address any questions or concerns that you may have regarding the information given to you following your procedure. If we do not reach you, we will leave a message.  We will attempt to reach you two times.  During this call, we will ask if you have developed any symptoms of COVID 19. If you develop any symptoms (ie: fever, flu-like symptoms, shortness of breath, cough etc.) before then, please call [phone].  If you test positive for Covid 19 in the 2 weeks post procedure, please call and report this information to [place].    If any biopsies were taken you will be contacted by phone or by letter within the next 1-3 weeks.  Please call us at [phone] if you have not heard about the biopsies in 3 weeks.    SIGNATURES/CONFIDENTIALITY:  You and/or your care partner have signed paperwork which will be entered into your electronic medical record.  These signatures attest to the fact that that the information above on your After Visit Summary has been reviewed and is understood.  Full responsibility of the confidentiality of this discharge information lies with you and/or your care-partner.

## 2019-11-01 NOTE — Op Note (Signed)
[facility] Patient Name: Jason Arnold Procedure Date: 11/01/2019 8:09 AM MRN: [id number] Endoscopist: Gatha Mayer , MD Age: 58 Referring MD:  Date of Birth: 12/17/1961 Gender: Male Account #: [id number] Procedure:                Colonoscopy Indications:              Surveillance: Personal history of adenomatous                            polyps on last colonoscopy 5 years ago Medicines:                Propofol per Anesthesia, Monitored Anesthesia Care Procedure:                Pre-Anesthesia Assessment:                           - Prior to the procedure, a History and Physical                            was performed, and patient medications and                            allergies were reviewed. The patient's tolerance of                            previous anesthesia was also reviewed. The risks                            and benefits of the procedure and the sedation                            options and risks were discussed with the patient.                            All questions were answered, and informed consent                            was obtained. Prior Anticoagulants: The patient has                            taken no previous anticoagulant or antiplatelet                            agents. ASA Grade Assessment: II - A patient with                            mild systemic disease. After reviewing the risks                            and benefits, the patient was deemed in                            satisfactory condition to undergo the procedure.  After obtaining informed consent, the colonoscope                            was passed under direct vision. Throughout the                            procedure, the patient's blood pressure, pulse, and                            oxygen saturations were monitored continuously. The                            Colonoscope was introduced through the anus and   advanced to the the cecum, identified by                            appendiceal orifice and ileocecal valve. The                            colonoscopy was performed without difficulty. The                            patient tolerated the procedure well. The quality                            of the bowel preparation was excellent. The                            ileocecal valve, appendiceal orifice, and rectum                            were photographed. The bowel preparation used was                            Miralax via split dose instruction. Scope In: 8:31:17 AM Scope Out: 8:44:08 AM Scope Withdrawal Time: 0 hours 11 minutes 5 seconds  Total Procedure Duration: 0 hours 12 minutes 51 seconds  Findings:                 The perianal and digital rectal examinations were                            normal. Pertinent negatives include normal prostate                            (size, shape, and consistency).                           Two sessile polyps were found in the sigmoid colon                            and transverse colon. The polyps were diminutive in                            size. These polyps were  removed with a cold snare.                            Resection and retrieval were complete. Verification                            of patient identification for the specimen was                            done. Estimated blood loss was minimal.                           Multiple diverticula were found in the left colon.                           The exam was otherwise without abnormality on                            direct and retroflexion views. Complications:            No immediate complications. Estimated Blood Loss:     Estimated blood loss was minimal. Impression:               - Two diminutive polyps in the sigmoid colon and in                            the transverse colon, removed with a cold snare.                            Resected and retrieved.                            - Diverticulosis in the left colon.                           - The examination was otherwise normal on direct                            and retroflexion views.                           - Personal history of colonic polyps. Diminutive                            adenoma 1 each 2009 and 2014 Recommendation:           - Patient has a contact number available for                            emergencies. The signs and symptoms of potential                            delayed complications were discussed with the                            patient. Return to normal  activities tomorrow.                            Written discharge instructions were provided to the                            patient.                           - Resume previous diet.                           - Continue present medications.                           - Repeat colonoscopy is recommended for                            surveillance. The colonoscopy date will be                            determined after pathology results from today's                            exam become available for review. Gatha Mayer, MD 11/01/2019 8:51:33 AM This report has been signed electronically.

## 2019-11-01 NOTE — Progress Notes (Signed)
Called to room to assist during endoscopic procedure.  Patient ID and intended procedure confirmed with present staff. Received instructions for my participation in the procedure from the performing physician.  

## 2019-11-01 NOTE — Progress Notes (Signed)
PT taken to PACU. Monitors in place. VSS. Report given to RN. 

## 2019-11-01 NOTE — Progress Notes (Signed)
Pt's states no medical or surgical changes since previsit or office visit.  Vital signs done by WR. Temp done by JB.

## 2019-11-05 ENCOUNTER — Telehealth: Payer: Self-pay

## 2019-11-05 NOTE — Telephone Encounter (Signed)
LVM

## 2019-11-05 NOTE — Telephone Encounter (Signed)
Second post procedure follow up call, no answer 

## 2019-11-14 ENCOUNTER — Encounter: Payer: Self-pay | Admitting: Internal Medicine

## 2019-11-14 DIAGNOSIS — D3A8 Other benign neuroendocrine tumors: Secondary | ICD-10-CM | POA: Insufficient documentation

## 2019-11-14 NOTE — Progress Notes (Signed)
Diminutive adenoma + diminutive NET Recall 2025

## 2019-11-20 ENCOUNTER — Encounter: Payer: Self-pay | Admitting: Family

## 2020-05-15 ENCOUNTER — Telehealth: Payer: Self-pay | Admitting: Family

## 2020-05-15 NOTE — Telephone Encounter (Signed)
OK with me.

## 2020-05-15 NOTE — Telephone Encounter (Signed)
Pt would like to transfer to Dr. [place] from Dr. Inda Castle because he is now working in [place] and it's convenient for pt to have a physician in [place]. Is it okay, for pt to transfer? Please advise. Thanks

## 2020-05-18 NOTE — Telephone Encounter (Signed)
Fine with me

## 2020-07-09 DIAGNOSIS — H6123 Impacted cerumen, bilateral: Secondary | ICD-10-CM | POA: Diagnosis not present

## 2020-07-09 DIAGNOSIS — R030 Elevated blood-pressure reading, without diagnosis of hypertension: Secondary | ICD-10-CM | POA: Diagnosis not present

## 2020-07-29 ENCOUNTER — Other Ambulatory Visit: Payer: Self-pay

## 2020-07-29 ENCOUNTER — Encounter: Payer: Self-pay | Admitting: Family Medicine

## 2020-07-29 ENCOUNTER — Ambulatory Visit (INDEPENDENT_AMBULATORY_CARE_PROVIDER_SITE_OTHER): Payer: BC Managed Care – PPO | Admitting: Family Medicine

## 2020-07-29 VITALS — BP 138/82 | HR 84 | Temp 97.9°F | Resp 16 | Ht 74.0 in | Wt 166.1 lb

## 2020-07-29 DIAGNOSIS — Z1329 Encounter for screening for other suspected endocrine disorder: Secondary | ICD-10-CM

## 2020-07-29 DIAGNOSIS — Z125 Encounter for screening for malignant neoplasm of prostate: Secondary | ICD-10-CM

## 2020-07-29 DIAGNOSIS — Z13228 Encounter for screening for other metabolic disorders: Secondary | ICD-10-CM

## 2020-07-29 DIAGNOSIS — Z0000 Encounter for general adult medical examination without abnormal findings: Secondary | ICD-10-CM | POA: Diagnosis not present

## 2020-07-29 DIAGNOSIS — Z13220 Encounter for screening for lipoid disorders: Secondary | ICD-10-CM | POA: Diagnosis not present

## 2020-07-29 DIAGNOSIS — Z130 Encounter for screening for diseases of the blood and blood-forming organs and certain disorders involving the immune mechanism: Secondary | ICD-10-CM

## 2020-07-29 NOTE — Progress Notes (Addendum)
HPI: [name] is a 59 y.o. male, who is here today to establish care.  Former PCP: Jason Alar, PA Last preventive routine visit: 07/03/19  Chronic medical problems: hypercholesterinemia and Hx of astrocytoma 30 years ago. No concerns today,he needs a CPE for insurance purposes. No hx of HTN, sometimes BP is elevated during OV's. He monitor BP at home regularly: Most 110's-120/60-70's. Retired. Lives with his wife. He has 2 children.  He follows a healthful diet (plenty of vegetables and fruit) and exercises regularly. He walks 8 miles daily and is engaged in other outdoor activities like yard work.  Immunization History  Administered Date(s) Administered  . Influenza Whole 09/15/2008  . Influenza, Seasonal, Injecte, Preservative Fre 09/28/2015  . Influenza,inj,Quad PF,6+ Mos 09/26/2016, 09/09/2017, 10/02/2018, 08/25/2019  . Influenza-Unspecified 09/26/2018  . Td 12/27/2001  . Tdap 04/05/2013  . Zoster Recombinat (Shingrix) 07/03/2019, 09/05/2019    Prostate cancer screening: Not sure about last one. Nocturia x 1-2, stable for years.  Colon cancer screening: Colonoscopy   He drinks bourbon 4-6 drinks daily, about 1.5 oz each. No hx of tobacco use.    Review of Systems  Constitutional: Negative for activity change, appetite change, fatigue and fever.  HENT: Negative for mouth sores, nosebleeds and sore throat.   Eyes: Negative for redness and visual disturbance.  Respiratory: Negative for cough, shortness of breath and wheezing.   Cardiovascular: Negative for chest pain, palpitations and leg swelling.  Gastrointestinal: Negative for abdominal pain, nausea and vomiting.  Endocrine: Negative for cold intolerance, heat intolerance, polydipsia and polyphagia.  Genitourinary: Negative for decreased urine volume, dysuria and hematuria.  Musculoskeletal: Negative for gait problem and myalgias.  Skin: Negative for rash and wound.  Allergic/Immunologic:  Negative for environmental allergies.  Neurological: Negative for syncope, weakness and headaches.  Psychiatric/Behavioral: Negative for confusion. The patient is not nervous/anxious.   Rest see pertinent positives and negatives per HPI.  Current Outpatient Medications on File Prior to Visit  Medication Sig Dispense Refill  . Levomefolic Acid (5-MTHF PO) Take 1 capsule by mouth daily.    Marland Kitchen VITAMIN B COMPLEX-C PO Take by mouth.     No current facility-administered medications on file prior to visit.   Past Medical History:  Diagnosis Date  . 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 Microscopic hematuria   . Personal history of colonic polyps - adenoma 06/19/2009   No Known Allergies  Family History  Problem Relation Age of Onset  . Cancer Mother        history of colon cancer in her 110's  . Colon cancer Mother 42  . Hyperlipidemia Father   . Heart attack Father 96       deceased  . Other Sister        MTHFR C677T mutation  . Esophageal cancer Neg Hx   . Rectal cancer Neg Hx   . Stomach cancer Neg Hx     Social History   Socioeconomic History  . Marital status: Married    Spouse name: Not on file  . Number of children: 2  . Years of education: Not on file  . Highest education level: Not on file  Occupational History    Employer: [employer]  Tobacco Use  . Smoking status: Never Smoker  . Smokeless tobacco: Never  Used  Vaping Use  . Vaping Use: Never used  Substance and Sexual Activity  . Alcohol use: Yes    Alcohol/week: 42.0 standard drinks    Types: 42 Standard drinks or equivalent per week    Comment: has about 5 drinks a day  . Drug use: No  . Sexual activity: Not on file  Other Topics Concern  . 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   Son born 2002 (Jason Arnold)   Daughter- 1997 (working on her masters in Radio broadcast assistant)   Completed doctoral degree in [place] a Magazine features editor   Social Determinants of Radio broadcast assistant Strain:   . Difficulty of Paying Living Expenses:   Food Insecurity:   . Worried About Charity fundraiser in the Last Year:   . Arboriculturist in the Last Year:   Transportation Needs:   . Film/video editor (Medical):   Marland Kitchen Lack of Transportation (Non-Medical):   Physical Activity:   . Days of Exercise per Week:   . Minutes of Exercise per Session:   Stress:   . Feeling of Stress :   Social Connections:   . Frequency of Communication with Friends and Family:   . Frequency of Social Gatherings with Friends and Family:   . Attends Religious Services:   . Active Member of Clubs or Organizations:   . Attends Archivist Meetings:   Marland Kitchen Marital Status:     Vitals:   07/29/20 1357  BP: 138/82  Pulse: 84  Resp: 16  Temp: 97.9 F (36.6 C)  SpO2: 97%   Body mass index is 21.33 kg/m.  Physical Exam Vitals and nursing note reviewed.  Constitutional:      General: He is not in acute distress.    Appearance: He is well-developed and normal weight.  HENT:     Head: Normocephalic and atraumatic.     Right Ear: Tympanic membrane, ear canal and external ear normal.     Left Ear: Tympanic membrane, ear canal and external ear normal.     Mouth/Throat:     Mouth: Mucous membranes are moist.     Tongue: No lesions.     Pharynx: Oropharynx is clear.  Eyes:     Extraocular Movements: Extraocular movements intact.     Conjunctiva/sclera: Conjunctivae normal.     Pupils: Pupils are equal, round, and reactive to light.  Neck:     Thyroid: No thyroid mass or thyromegaly.     Trachea: No tracheal deviation.  Cardiovascular:     Rate and Rhythm: Normal rate and regular rhythm.     Pulses:          Dorsalis pedis pulses  are 2+ on the right side and 2+ on the left side.     Heart sounds: No murmur heard.   Pulmonary:     Effort: Pulmonary effort is normal. No respiratory distress.     Breath sounds: Normal breath sounds.  Abdominal:     Palpations: Abdomen is soft. There is no hepatomegaly or mass.     Tenderness: There is no abdominal tenderness.  Genitourinary:    Comments: No concerns. Musculoskeletal:        General: No tenderness.     Cervical back: Normal range of motion.     Comments: No major deformities appreciated  and no signs of synovitis.  Lymphadenopathy:     Cervical: No cervical adenopathy.     Upper Body:     Right upper body: No supraclavicular adenopathy.     Left upper body: No supraclavicular adenopathy.  Skin:    General: Skin is warm.     Findings: No erythema or rash.  Neurological:     General: No focal deficit present.     Mental Status: He is alert and oriented to person, place, and time.     Cranial Nerves: No cranial nerve deficit.     Sensory: No sensory deficit.     Motor: Motor function is intact.     Coordination: Coordination normal.     Gait: Gait normal.     Deep Tendon Reflexes:     Reflex Scores:      Bicep reflexes are 2+ on the right side and 2+ on the left side.      Patellar reflexes are 2+ on the right side and 2+ on the left side. Psychiatric:        Mood and Affect: Mood and affect normal.     Comments: Well groomed, good eye contact.    ASSESSMENT AND PLAN:  [name] was seen today for establish care and annual exam.  Diagnoses and all orders for this visit:  Routine general medical examination at a health care facility We discussed the importance of regular physical activity and healthy diet for prevention of chronic illness and/or complications. Preventive guidelines reviewed. Vaccination up to date. We discussed current recommendations for alcohol consumption in males, recommend decreasing amount. Next CPE in a year.  Screening for  lipoid disorders -     Lipid panel; Future  Screening for endocrine, metabolic and immunity disorder -     Comprehensive metabolic panel; Future -     Hemoglobin A1c; Future -     Lipid panel; Future  Screening for malignant neoplasm of prostate -     PSA; Future  She will come back for fasting labs.  Return in 1 year (on 07/29/2021) for CPE. Needs fasting labs.   Korde Jeppsen G. Martinique, MD  [facility] [place] office.

## 2020-07-29 NOTE — Patient Instructions (Signed)
A few things to remember from today's visit:  Routine general medical examination at a health care facility  Screening for lipoid disorders  Screening for endocrine, metabolic and immunity disorder  . At least 150 minutes of moderate exercise per week, daily brisk walking for 15-30 min is a good exercise option. Healthy diet low in saturated (animal) fats and sweets and consisting of fresh fruits and vegetables, lean meats such as fish and white chicken and whole grains.  - Vaccines:  Tdap vaccine every 10 years.  Shingles vaccine recommended at age 72, could be given after 61 years of age but not sure about insurance coverage.  Pneumonia vaccines: Pneumovax at 30   -Screening recommendations for low/normal risk males:  Screening for diabetes at age 24 and every 3 years. Earlier screening if cardiovascular risk factors.   Lipid screening at 35 and every 3 years. Screening starts in younger males with cardiovascular risk factors.  Colon cancer screening is now at age 36 but your insurance may not cover until age 63 .screening is recommended age 80.  Prostate cancer screening: some controversy, starts usually at 56: Rectal exam and PSA.  Aortic Abdominal Aneurism once between 54 and 46 years old if ever smoker.  Also recommended:  1. Dental visit- Brush and floss your teeth twice daily; visit your dentist twice a year. 2. Eye doctor- Get an eye exam at least every 2 years. 3. Helmet use- Always wear a helmet when riding a bicycle, motorcycle, rollerblading or skateboarding. 4. Safe sex- If you may be exposed to sexually transmitted infections, use a condom. 5. Seat belts- Seat belts can save your live; always wear one. 6. Smoke/Carbon Monoxide detectors- These detectors need to be installed on the appropriate level of your home. Replace batteries at least once a year. 7. Skin cancer- When out in the sun please cover up and use sunscreen 15 SPF or higher. 8. Violence- If  anyone is threatening or hurting you, please tell your healthcare provider.  9. Drink alcohol in moderation- Limit alcohol intake to one drink or less per day. Never drink and drive.   Please be sure medication list is accurate. If a new problem present, please set up appointment sooner than planned today.

## 2020-07-31 ENCOUNTER — Other Ambulatory Visit: Payer: Self-pay

## 2020-07-31 ENCOUNTER — Other Ambulatory Visit: Payer: BC Managed Care – PPO

## 2020-07-31 DIAGNOSIS — Z13220 Encounter for screening for lipoid disorders: Secondary | ICD-10-CM

## 2020-07-31 DIAGNOSIS — Z1329 Encounter for screening for other suspected endocrine disorder: Secondary | ICD-10-CM | POA: Diagnosis not present

## 2020-07-31 DIAGNOSIS — Z13228 Encounter for screening for other metabolic disorders: Secondary | ICD-10-CM

## 2020-07-31 DIAGNOSIS — Z125 Encounter for screening for malignant neoplasm of prostate: Secondary | ICD-10-CM | POA: Diagnosis not present

## 2020-07-31 DIAGNOSIS — Z131 Encounter for screening for diabetes mellitus: Secondary | ICD-10-CM | POA: Diagnosis not present

## 2020-07-31 DIAGNOSIS — Z0000 Encounter for general adult medical examination without abnormal findings: Secondary | ICD-10-CM

## 2020-07-31 NOTE — Addendum Note (Signed)
Addended by: Martinique, Cregg Jutte G on: 07/31/2020 06:13 PM   Modules accepted: Level of Service

## 2020-08-01 LAB — COMPREHENSIVE METABOLIC PANEL
AG Ratio: 1.5 (calc) (ref 1.0–2.5)
ALT: 24 U/L (ref 9–46)
AST: 19 U/L (ref 10–35)
Albumin: 4.4 g/dL (ref 3.6–5.1)
Alkaline phosphatase (APISO): 61 U/L (ref 35–144)
BUN: 13 mg/dL (ref 7–25)
CO2: 29 mmol/L (ref 20–32)
Calcium: 9.6 mg/dL (ref 8.6–10.3)
Chloride: 102 mmol/L (ref 98–110)
Creat: 0.86 mg/dL (ref 0.70–1.25)
Globulin: 2.9 g/dL (calc) (ref 1.9–3.7)
Glucose, Bld: 89 mg/dL (ref 65–99)
Potassium: 5 mmol/L (ref 3.5–5.3)
Sodium: 138 mmol/L (ref 135–146)
Total Bilirubin: 0.9 mg/dL (ref 0.2–1.2)
Total Protein: 7.3 g/dL (ref 6.1–8.1)

## 2020-08-01 LAB — HEMOGLOBIN A1C
Hgb A1c MFr Bld: 5.3 % of total Hgb (ref <5.7)
Mean Plasma Glucose: 105 (calc)
eAG (mmol/L): 5.8 (calc)

## 2020-08-01 LAB — LIPID PANEL
Cholesterol: 191 mg/dL (ref <200)
HDL: 101 mg/dL (ref >=40)
LDL Cholesterol (Calc): 75 mg/dL (calc)
Non-HDL Cholesterol (Calc): 90 mg/dL (calc) (ref <130)
Total CHOL/HDL Ratio: 1.9 (calc) (ref <5.0)
Triglycerides: 72 mg/dL (ref <150)

## 2020-08-01 LAB — PSA: PSA: 0.9 ng/mL (ref <=4.0)

## 2020-09-09 DIAGNOSIS — L578 Other skin changes due to chronic exposure to nonionizing radiation: Secondary | ICD-10-CM | POA: Diagnosis not present

## 2020-09-09 DIAGNOSIS — L814 Other melanin hyperpigmentation: Secondary | ICD-10-CM | POA: Diagnosis not present

## 2020-09-09 DIAGNOSIS — D225 Melanocytic nevi of trunk: Secondary | ICD-10-CM | POA: Diagnosis not present

## 2020-09-09 DIAGNOSIS — L821 Other seborrheic keratosis: Secondary | ICD-10-CM | POA: Diagnosis not present

## 2021-08-03 ENCOUNTER — Other Ambulatory Visit: Payer: Self-pay

## 2021-08-03 NOTE — Progress Notes (Signed)
HPI: Jason Arnold is a 60 y.o.male here today for his routine physical examination.  Last CPE: 07/29/20 He lives with his wife. Retired. Sleeps about 8-9 hours.  Regular exercise 3 or more times per week: 8 miles or more daily and yard work. Following a healthy diet: Yes. He cooks, hardy ever eats out, chicken and fish.  Chronic medical problems: hypercholesterinemia and Hx of astrocytoma 30 years ago.  Immunization History  Administered Date(s) Administered   Influenza Whole 09/15/2008   Influenza, Seasonal, Injecte, Preservative Fre 09/28/2015   Influenza,inj,Quad PF,6+ Mos 09/26/2016, 09/09/2017, 10/02/2018, 08/25/2019   Influenza-Unspecified 09/26/2018   Td 12/27/2001   Tdap 04/05/2013   Zoster Recombinat (Shingrix) 07/03/2019, 09/05/2019   -Hep C screening: 06/14/16 NR Last colon cancer screening: 11/01/2019 repeat in 5 years. Last prostate ca screening: 07/31/20 0.9 Nocturia x 2, stable for a while. He drinks a lot of water.  Negative for tobacco or Hx of illicit drug use. He has 6 bourbon drinks at night, so he would like LFT's done.  -Concerns and/or follow up today:   TC has been mildly elevated in the past, 06/2019 TC was 203. Currently he is on nonpharmacologic treatment.  Lab Results  Component Value Date   CHOL 191 07/31/2020   HDL 101 07/31/2020   LDLCALC 75 07/31/2020   TRIG 72 07/31/2020   CHOLHDL 1.9 07/31/2020   A few days ago he has left flank pain for an hours or so, pain exacerbated by urination. Resolved, he assumes it was a kidney stone, no prior hx. Negative fir dysuria or gross hematuria. He has hx of microscopic hematuria, reporting negative work up, he is not longer seeing urologist.  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  He is on iron and folate supplementation.  Review of Systems  Constitutional:  Negative for activity change, appetite change, fatigue and fever.  HENT:  Negative for mouth sores,  nosebleeds, sore throat, trouble swallowing and voice change.   Eyes:  Negative for redness and visual disturbance.  Respiratory:  Negative for cough, shortness of breath and wheezing.   Cardiovascular:  Negative for chest pain, palpitations and leg swelling.  Gastrointestinal:  Negative for abdominal pain, blood in stool, nausea and vomiting.  Endocrine: Negative for cold intolerance, heat intolerance, polydipsia, polyphagia and polyuria.  Genitourinary:  Negative for decreased urine volume, dysuria, genital sores, hematuria and testicular pain.  Musculoskeletal:  Negative for gait problem and myalgias.  Skin:  Negative for color change and rash.  Allergic/Immunologic: Negative for environmental allergies.  Neurological:  Negative for syncope, weakness and headaches.  Hematological:  Negative for adenopathy. Does not bruise/bleed easily.  Psychiatric/Behavioral:  Negative for confusion and sleep disturbance. The patient is not nervous/anxious.   All other systems reviewed and are negative.  Current Outpatient Medications on File Prior to Visit  Medication Sig Dispense Refill   Levomefolic Acid (5-MTHF PO) Take 1 capsule by mouth daily.     VITAMIN B COMPLEX-C PO Take by mouth.     No current facility-administered medications on file prior to visit.   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   COLONOSCOPY     HYDROCELE EXCISION / REPAIR  1991   No Known Allergies  Family History  Problem Relation Age of Onset   Cancer Mother        history of colon cancer in her 78's   Colon cancer Mother 46    Hyperlipidemia Father    Heart attack Father 52       deceased   Other Sister        MTHFR C677T mutation   Esophageal cancer Neg Hx    Rectal cancer Neg Hx    Stomach cancer Neg Hx    Social History   Socioeconomic History   Marital status: Married    Spouse name: Not on file   Number of children: 2   Years of education: Not on file   Highest education level: Not on file  Occupational History    Employer: [employer]  Tobacco Use   Smoking status: Never   Smokeless tobacco: Never  Vaping Use   Vaping Use: Never used  Substance and Sexual Activity   Alcohol use: Yes    Alcohol/week: 42.0 standard drinks    Types: 42 Standard drinks or equivalent per week    Comment: has about 5 drinks a day   Drug use: No   Sexual activity: Not on file  Other Topics Concern   Not on file  Social History Narrative   Married   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   Son born 2002 (elon)   Daughter- 1997 (working on her masters in Radio broadcast assistant)   Completed doctoral degree in Cabin crew   Has a dog   Social Determinants of Radio broadcast assistant Strain: Not on [employer] Insecurity: Not on file  Transportation Needs: Not on file  Physical Activity: Not on file  Stress: Not on file  Social Connections: Not on file   Vitals:   08/04/21 0752  BP: 120/70  Pulse: 66  Resp: 16  SpO2: 97%   Body mass index is 21.46 kg/m.  Wt Readings from Last 3 Encounters:  08/04/21 167 lb 2 oz (75.8 kg)  07/29/20 166 lb 2 oz (75.4 kg)  11/01/19 167 lb (75.8 kg)   Physical Exam Vitals and nursing note reviewed.  Constitutional:      General: He is not in acute distress.    Appearance: He is well-developed and normal weight.  HENT:     Head: Normocephalic and atraumatic.     Right Ear: Tympanic membrane, ear canal and external ear normal.     Left Ear: Tympanic membrane, ear canal and external  ear normal.     Mouth/Throat:     Mouth: Mucous membranes are moist.     Pharynx: Oropharynx is clear.  Eyes:     Extraocular Movements: Extraocular movements intact.     Conjunctiva/sclera: Conjunctivae normal.     Pupils: Pupils are equal, round, and reactive to light.  Neck:     Thyroid: No thyroid mass or thyromegaly.  Cardiovascular:     Rate and Rhythm: Normal rate and regular rhythm.     Pulses:          Dorsalis pedis pulses are 2+ on the right side and 2+ on the left side.     Heart sounds: No murmur heard. Pulmonary:     Effort: Pulmonary effort is  normal. No respiratory distress.     Breath sounds: Normal breath sounds.  Chest:  Breasts:    Right: No supraclavicular adenopathy.     Left: No supraclavicular adenopathy.  Abdominal:     Palpations: Abdomen is soft. There is no hepatomegaly or mass.     Tenderness: There is no abdominal tenderness. There is no right CVA tenderness or left CVA tenderness.  Genitourinary:    Comments: No concerns. Musculoskeletal:        General: No tenderness.     Cervical back: Normal range of motion.     Lumbar back: No tenderness or bony tenderness.     Comments: No major deformities appreciated and no signs of synovitis.  Lymphadenopathy:     Cervical: No cervical adenopathy.     Upper Body:     Right upper body: No supraclavicular adenopathy.     Left upper body: No supraclavicular adenopathy.  Skin:    General: Skin is warm.     Findings: No erythema.  Neurological:     Mental Status: He is alert and oriented to person, place, and time.     Cranial Nerves: No cranial nerve deficit.     Sensory: No sensory deficit.     Coordination: Coordination normal.     Gait: Gait normal.     Deep Tendon Reflexes:     Reflex Scores:      Bicep reflexes are 2+ on the right side and 2+ on the left side.      Patellar reflexes are 2+ on the right side and 2+ on the left side.  ASSESSMENT AND PLAN:  JasonJason Arnold was seen today for annual  exam.  Diagnoses and all orders for this visit: Orders Placed This Encounter  Procedures   PSA   Lipid panel   Hemoglobin A1c   Urinalysis, Routine w reflex microscopic   Comprehensive metabolic panel   CBC   Lab Results  Component Value Date   PSA 1.28 08/04/2021             Lab Results  Component Value Date   CHOL 202 (H) 08/04/2021   HDL 108.50 08/04/2021   LDLCALC 80 08/04/2021   TRIG 67.0 08/04/2021   CHOLHDL 2 08/04/2021   Lab Results  Component Value Date   HGBA1C 5.5 08/04/2021   Lab Results  Component Value Date   ALT 29 08/04/2021   AST 23 08/04/2021   ALKPHOS 63 08/04/2021   BILITOT 0.8 08/04/2021   Lab Results  Component Value Date   CREATININE 0.82 08/04/2021   BUN 10 08/04/2021   NA 137 08/04/2021   K 4.7 08/04/2021   CL 101 08/04/2021   CO2 27 08/04/2021   Routine general medical examination at a health care facility We discussed the importance of regular physical activity and healthy diet for prevention of chronic illness and/or complications. Preventive guidelines reviewed. Vaccination up-to-date. We discussed general recommendations in regard to alcohol intake as well as possible complications of alcohol abuse. Next CPE in a year.  The ASCVD Risk score Mikey Bussing DC Jr., et al., 2013) failed to calculate for the following reasons:   The valid HDL cholesterol range is 20 to 100 mg/dL  Pure hypercholesterolemia Continue nonpharmacologic treatment. Further recommendation will be given according to 10-year CVD risk + lipid panel numbers.  Screening for endocrine, metabolic and immunity disorder -     Hemoglobin A1c -     Comprehensive metabolic panel  Methylenetetrahydrofolate reductase deficiency (HCC) Continue iron  and folate supplementation.  History of hematuria Reporting negative urologic work-up. Instructed about warning signs.  Prostate cancer screening -     PSA  Return in 1 year (on 08/04/2022) for CPE.   Sherell Christoffel G. Martinique,  MD  [facility]. [place] office.

## 2021-08-04 ENCOUNTER — Encounter: Payer: Self-pay | Admitting: Family Medicine

## 2021-08-04 ENCOUNTER — Ambulatory Visit (INDEPENDENT_AMBULATORY_CARE_PROVIDER_SITE_OTHER): Payer: BC Managed Care – PPO | Admitting: Family Medicine

## 2021-08-04 VITALS — BP 120/70 | HR 66 | Resp 16 | Ht 74.0 in | Wt 167.1 lb

## 2021-08-04 DIAGNOSIS — Z1329 Encounter for screening for other suspected endocrine disorder: Secondary | ICD-10-CM | POA: Diagnosis not present

## 2021-08-04 DIAGNOSIS — E7800 Pure hypercholesterolemia, unspecified: Secondary | ICD-10-CM

## 2021-08-04 DIAGNOSIS — Z87448 Personal history of other diseases of urinary system: Secondary | ICD-10-CM | POA: Diagnosis not present

## 2021-08-04 DIAGNOSIS — Z130 Encounter for screening for diseases of the blood and blood-forming organs and certain disorders involving the immune mechanism: Secondary | ICD-10-CM | POA: Diagnosis not present

## 2021-08-04 DIAGNOSIS — Z0000 Encounter for general adult medical examination without abnormal findings: Secondary | ICD-10-CM | POA: Diagnosis not present

## 2021-08-04 DIAGNOSIS — E7212 Methylenetetrahydrofolate reductase deficiency: Secondary | ICD-10-CM | POA: Diagnosis not present

## 2021-08-04 DIAGNOSIS — E785 Hyperlipidemia, unspecified: Secondary | ICD-10-CM | POA: Insufficient documentation

## 2021-08-04 DIAGNOSIS — Z125 Encounter for screening for malignant neoplasm of prostate: Secondary | ICD-10-CM

## 2021-08-04 DIAGNOSIS — Z13228 Encounter for screening for other metabolic disorders: Secondary | ICD-10-CM | POA: Diagnosis not present

## 2021-08-04 DIAGNOSIS — Z13220 Encounter for screening for lipoid disorders: Secondary | ICD-10-CM

## 2021-08-04 LAB — URINALYSIS, ROUTINE W REFLEX MICROSCOPIC
Bilirubin Urine: NEGATIVE
Ketones, ur: NEGATIVE
Leukocytes,Ua: NEGATIVE
Nitrite: NEGATIVE
Specific Gravity, Urine: <=1.005 — AB (ref 1.000–1.030)
Total Protein, Urine: NEGATIVE
Urine Glucose: NEGATIVE
Urobilinogen, UA: 0.2 (ref 0.0–1.0)
WBC, UA: NONE SEEN (ref 0–?)
pH: 6 (ref 5.0–8.0)

## 2021-08-04 LAB — COMPREHENSIVE METABOLIC PANEL
ALT: 29 U/L (ref 0–53)
AST: 23 U/L (ref 0–37)
Albumin: 4.5 g/dL (ref 3.5–5.2)
Alkaline Phosphatase: 63 U/L (ref 39–117)
BUN: 10 mg/dL (ref 6–23)
CO2: 27 mEq/L (ref 19–32)
Calcium: 9.5 mg/dL (ref 8.4–10.5)
Chloride: 101 mEq/L (ref 96–112)
Creatinine, Ser: 0.82 mg/dL (ref 0.40–1.50)
GFR: 94.37 mL/min (ref >60.00)
Glucose, Bld: 90 mg/dL (ref 70–99)
Potassium: 4.7 mEq/L (ref 3.5–5.1)
Sodium: 137 mEq/L (ref 135–145)
Total Bilirubin: 0.8 mg/dL (ref 0.2–1.2)
Total Protein: 7.5 g/dL (ref 6.0–8.3)

## 2021-08-04 LAB — LIPID PANEL
Cholesterol: 202 mg/dL — ABNORMAL HIGH (ref 0–200)
HDL: 108.5 mg/dL (ref >39.00)
LDL Cholesterol: 80 mg/dL (ref 0–99)
NonHDL: 93.41
Total CHOL/HDL Ratio: 2
Triglycerides: 67 mg/dL (ref 0.0–149.0)
VLDL: 13.4 mg/dL (ref 0.0–40.0)

## 2021-08-04 LAB — HEMOGLOBIN A1C: Hgb A1c MFr Bld: 5.5 % (ref 4.6–6.5)

## 2021-08-04 LAB — CBC
HCT: 43.5 % (ref 39.0–52.0)
Hemoglobin: 14.8 g/dL (ref 13.0–17.0)
MCHC: 34 g/dL (ref 30.0–36.0)
MCV: 101 fl — ABNORMAL HIGH (ref 78.0–100.0)
Platelets: 279 10*3/uL (ref 150.0–400.0)
RBC: 4.3 Mil/uL (ref 4.22–5.81)
RDW: 12.9 % (ref 11.5–15.5)
WBC: 4.5 10*3/uL (ref 4.0–10.5)

## 2021-08-04 LAB — PSA: PSA: 1.28 ng/mL (ref 0.10–4.00)

## 2021-08-04 NOTE — Patient Instructions (Addendum)
A few things to remember from today's visit:  Routine general medical examination at a health care facility  Screening for lipoid disorders - Plan: Lipid panel  Screening for endocrine, metabolic and immunity disorder - Plan: Hemoglobin A1c, Comprehensive metabolic panel  Screening for malignant neoplasm of prostate  Methylenetetrahydrofolate reductase deficiency ([place]), Chronic  History of hematuria - Plan: Urinalysis, Routine w reflex microscopic, CBC  Prostate cancer screening - Plan: PSA   At least 150 minutes of moderate exercise per week, daily brisk walking for 15-30 min is a good exercise option. Healthy diet low in saturated (animal) fats and sweets and consisting of fresh fruits and vegetables, lean meats such as fish and white chicken and whole grains.  - Vaccines:  Tdap vaccine every 10 years.  Shingles vaccine recommended at age 38, could be given after 62 years of age but not sure about insurance coverage.  Pneumonia vaccines: Pneumovax at 35  -Screening recommendations for low/normal risk males:  Screening for diabetes at age 47 and every 3 years. Earlier screening if cardiovascular risk factors.  Lipid screening at 35 and every 3 years. Screening starts in younger males with cardiovascular risk factors.  Colon cancer screening is now at age 37 but your insurance may not cover until age 13 .screening is recommended age 27.  Prostate cancer screening: some controversy, starts usually at 72: Rectal exam and PSA.  Aortic Abdominal Aneurism once between 61 and 28 years old if ever smoker.  Also recommended:  Dental visit- Brush and floss your teeth twice daily; visit your dentist twice a year. Eye doctor- Get an eye exam at least every 2 years. Helmet use- Always wear a helmet when riding a bicycle, motorcycle, rollerblading or skateboarding. Safe sex- If you may be exposed to sexually transmitted infections, use a condom. Seat belts- Seat belts can save your  live; always wear one. Smoke/Carbon Monoxide detectors- These detectors need to be installed on the appropriate level of your home. Replace batteries at least once a year. Skin cancer- When out in the sun please cover up and use sunscreen 15 SPF or higher. Violence- If anyone is threatening or hurting you, please tell your healthcare provider.  Drink alcohol in moderation- Limit alcohol intake to one drink or less per day. Never drink and drive.  If you need refills please call your pharmacy. Do not use My Chart to request refills or for acute issues that need immediate attention.   Please be sure medication list is accurate. If a new problem present, please set up appointment sooner than planned today.

## 2022-04-20 DIAGNOSIS — L578 Other skin changes due to chronic exposure to nonionizing radiation: Secondary | ICD-10-CM | POA: Diagnosis not present

## 2022-04-20 DIAGNOSIS — D225 Melanocytic nevi of trunk: Secondary | ICD-10-CM | POA: Diagnosis not present

## 2022-04-20 DIAGNOSIS — L814 Other melanin hyperpigmentation: Secondary | ICD-10-CM | POA: Diagnosis not present

## 2022-04-20 DIAGNOSIS — L821 Other seborrheic keratosis: Secondary | ICD-10-CM | POA: Diagnosis not present

## 2022-08-16 NOTE — Progress Notes (Unsigned)
HPI: Jason Arnold is a 61 y.o.male here today for his routine physical examination.  Last CPE: 08/04/21  He exercises regularly, walks several times per week and active with outdoor chores/yard work. He follows a healthful diet, he cooks at home most of the time.  He eats vegetables daily, little red meat.  He is mainly chicken and fish.  He has 4-6 oz bourbon drinks at night. Sleeps about 8 hours.  Chronic medical problems: HLD, alcohol dependency uncomplicated,and hearing loss among some.  Immunization History  Administered Date(s) Administered   Influenza Whole 09/15/2008   Influenza, Seasonal, Injecte, Preservative Fre 09/28/2015   Influenza,inj,Quad PF,6+ Mos 09/26/2016, 09/09/2017, 10/02/2018, 08/25/2019   Influenza-Unspecified 09/26/2018   Td 12/27/2001   Tdap 04/05/2013   Zoster Recombinat (Shingrix) 07/03/2019, 09/05/2019   Health Maintenance  Topic Date Due   INFLUENZA VACCINE  07/27/2022   COVID-19 Vaccine (1) 09/02/2022 (Originally 06/01/1966)   HIV Screening  08/04/2029 (Originally 06/01/1976)   TETANUS/TDAP  04/06/2023   COLONOSCOPY (Pts 45-26yr Insurance coverage will need to be confirmed)  10/31/2024   Hepatitis C Screening  Completed   Zoster Vaccines- Shingrix  Completed   HPV VACCINES  Aged Out   Last prostate ca screening: Nocturia x0-1. Lab Results  Component Value Date   PSA 1.28 08/04/2021   PSA 0.9 07/31/2020   PSA 0.97 06/14/2016   Hyperlipidemia: He is on nonpharmacologic treatment. Lab Results  Component Value Date   CHOL 202 (H) 08/04/2021   HDL 108.50 08/04/2021   LDLCALC 80 08/04/2021   TRIG 67.0 08/04/2021   CHOLHDL 2 08/04/2021   Lab Results  Component Value Date   ALT 29 08/04/2021   AST 23 08/04/2021   ALKPHOS 63 08/04/2021   BILITOT 0.8 08/04/2021   Review of Systems  Constitutional:  Negative for activity change, appetite change and fever.  HENT:  Negative for mouth sores, nosebleeds, sore throat, trouble swallowing  and voice change.   Eyes:  Negative for redness and visual disturbance.  Respiratory:  Negative for cough, shortness of breath and wheezing.   Cardiovascular:  Negative for chest pain, palpitations and leg swelling.  Gastrointestinal:  Negative for abdominal pain, blood in stool, nausea and vomiting.  Endocrine: Negative for cold intolerance, heat intolerance, polydipsia, polyphagia and polyuria.  Genitourinary:  Negative for decreased urine volume, dysuria, genital sores, hematuria and testicular pain.  Musculoskeletal:  Negative for gait problem and joint swelling.  Skin:  Negative for color change and rash.  Allergic/Immunologic: Negative for environmental allergies.  Neurological:  Negative for syncope, weakness and headaches.  Hematological:  Negative for adenopathy. Does not bruise/bleed easily.  Psychiatric/Behavioral:  Negative for confusion, hallucinations and sleep disturbance.   All other systems reviewed and are negative.  Current Outpatient Medications on File Prior to Visit  Medication Sig Dispense Refill   Levomefolic Acid (5-MTHF PO) Take 1 capsule by mouth daily.     VITAMIN B COMPLEX-C PO Take by mouth.     No current facility-administered medications on file prior to visit.   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   Microscopic hematuria    Personal history of colonic polyps - adenoma 06/19/2009   Past Surgical History:  Procedure Laterality Date   BRAIN TUMOR EXCISION  1989   Grade II  astrocytoma on 3rd ventricle per pt   COLONOSCOPY     HYDROCELE EXCISION / REPAIR  1991   No Known Allergies  Family History  Problem Relation Age of Onset   Cancer Mother        history of colon cancer in her 76's   Colon cancer Mother 57   Hyperlipidemia Father    Heart attack Father 76        deceased   Other Sister        MTHFR C677T mutation   Esophageal cancer Neg Hx    Rectal cancer Neg Hx    Stomach cancer Neg Hx    Social History   Socioeconomic History   Marital status: Married    Spouse name: Not on file   Number of children: 2   Years of education: Not on file   Highest education level: Not on file  Occupational History    Employer: [employer]  Tobacco Use   Smoking status: Never   Smokeless tobacco: Never  Vaping Use   Vaping Use: Never used  Substance and Sexual Activity   Alcohol use: Yes    Alcohol/week: 42.0 standard drinks of alcohol    Types: 42 Standard drinks or equivalent per week    Comment: has about 5 drinks a day   Drug use: No   Sexual activity: Not on file  Other Topics Concern   Not on file  Social History Narrative   Married   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   Son born 2002 (elon)   Daughter- 1997 (working on her masters in Radio broadcast assistant)   Completed doctoral degree in Cabin crew   Has a dog   Social Determinants of Radio broadcast assistant Strain: Not on [employer] Insecurity: Not on file  Transportation Needs: Not on file  Physical Activity: Not on file  Stress: Not on file  Social Connections: Not on file   Vitals:   08/17/22 0755  BP: 128/80  Pulse: 66  Resp: 16  Temp: 98.5 F (36.9 C)  SpO2: 99%   Body mass index is 21.1 kg/m.  Wt Readings from Last 3 Encounters:  08/17/22 164 lb 6 oz (74.6 kg)  08/04/21 167 lb 2 oz (75.8 kg)  07/29/20 166 lb 2 oz (75.4 kg)   Physical Exam Vitals and nursing note reviewed.  Constitutional:      General: He is not in acute distress.    Appearance: He is well-developed.  HENT:     Head: Normocephalic and atraumatic.     Right Ear: Tympanic membrane, ear canal and external ear normal.     Left Ear: Tympanic membrane, ear canal and external ear normal.     Mouth/Throat:      Mouth: Mucous membranes are moist.     Pharynx: Oropharynx is clear.  Eyes:     Extraocular Movements: Extraocular movements intact.     Conjunctiva/sclera: Conjunctivae normal.     Pupils: Pupils are equal, round, and reactive to light.  Neck:     Thyroid: Thyromegaly (? left-sided.) present.     Trachea: No tracheal deviation.  Cardiovascular:     Rate and Rhythm: Normal rate and regular rhythm.     Pulses:          Dorsalis pedis pulses are 2+ on the right side and 2+ on the left side.     Heart sounds:  No murmur heard. Pulmonary:     Effort: Pulmonary effort is normal. No respiratory distress.     Breath sounds: Normal breath sounds.  Abdominal:     Palpations: Abdomen is soft. There is no hepatomegaly or mass.     Tenderness: There is no abdominal tenderness.  Genitourinary:    Comments: No concerns. Musculoskeletal:        General: No tenderness.     Cervical back: Normal range of motion.     Comments: No signs of synovitis.  Lymphadenopathy:     Cervical: No cervical adenopathy.     Upper Body:     Right upper body: No supraclavicular adenopathy.     Left upper body: No supraclavicular adenopathy.  Skin:    General: Skin is warm.     Findings: No erythema.  Neurological:     General: No focal deficit present.     Mental Status: He is alert and oriented to person, place, and time.     Cranial Nerves: No cranial nerve deficit.     Sensory: No sensory deficit.     Gait: Gait normal.     Deep Tendon Reflexes:     Reflex Scores:      Bicep reflexes are 2+ on the right side and 2+ on the left side.      Patellar reflexes are 2+ on the right side and 2+ on the left side. Psychiatric:        Mood and Affect: Mood and affect normal.   ASSESSMENT AND PLAN:  JasonVasil was seen today for annual exam.  Diagnoses and all orders for this visit: Orders Placed This Encounter  Procedures   Hemoglobin A1c   Lipid panel   Comprehensive metabolic panel   PSA(Must document  that pt has been informed of limitations of PSA testing.)   TSH   CBC   Lab Results  Component Value Date   WBC 4.4 08/17/2022   HGB 14.6 08/17/2022   HCT 42.5 08/17/2022   MCV 102.2 (H) 08/17/2022   PLT 286.0 08/17/2022   Lab Results  Component Value Date   TSH 2.42 08/17/2022   Lab Results  Component Value Date   CREATININE 0.80 08/17/2022   BUN 13 08/17/2022   NA 140 08/17/2022   K 4.5 08/17/2022   CL 101 08/17/2022   CO2 27 08/17/2022   Lab Results  Component Value Date   ALT 28 08/17/2022   AST 23 08/17/2022   ALKPHOS 65 08/17/2022   BILITOT 0.8 08/17/2022    PSA 1.29 08/17/2022   Lab Results  Component Value Date   HGBA1C 5.5 08/17/2022   Hyperlipidemia Non pharmacologic treatment to continue for now. Further recommendations will be given according to 10 years CVD risk score and lipid panel numbers.  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 CBC ordered today.  Routine general medical examination at a health care facility We discussed the importance of regular physical activity and healthy diet for prevention of chronic illness and/or complications. Preventive guidelines reviewed. Vaccination up to date. Next CPE in a year. The ASCVD Risk score (Arnett DK, et al., 2019) failed to calculate for the following reasons:   The valid HDL cholesterol range is 20 to 100 mg/dL  Enlarged thyroid gland We discussed clinical findings. He prefers to hold on imaging for now. Further recommendations according to TSH result.  Prostate cancer screening -     PSA(Must document that pt has been informed of limitations of  PSA testing.)  Screening for endocrine, metabolic and immunity disorder -     Comprehensive metabolic panel -     Hemoglobin A1c  Return in 1 year (on 08/18/2023) for cpe.  Kaylen Nghiem G. Martinique, MD  [facility]. [place] office.

## 2022-08-17 ENCOUNTER — Ambulatory Visit (INDEPENDENT_AMBULATORY_CARE_PROVIDER_SITE_OTHER): Payer: BC Managed Care – PPO | Admitting: Family Medicine

## 2022-08-17 ENCOUNTER — Encounter: Payer: Self-pay | Admitting: Family Medicine

## 2022-08-17 VITALS — BP 128/80 | HR 66 | Temp 98.5°F | Resp 16 | Ht 74.0 in | Wt 164.4 lb

## 2022-08-17 DIAGNOSIS — E049 Nontoxic goiter, unspecified: Secondary | ICD-10-CM | POA: Diagnosis not present

## 2022-08-17 DIAGNOSIS — Z1329 Encounter for screening for other suspected endocrine disorder: Secondary | ICD-10-CM | POA: Diagnosis not present

## 2022-08-17 DIAGNOSIS — Z1589 Genetic susceptibility to other disease: Secondary | ICD-10-CM

## 2022-08-17 DIAGNOSIS — Z13228 Encounter for screening for other metabolic disorders: Secondary | ICD-10-CM

## 2022-08-17 DIAGNOSIS — Z125 Encounter for screening for malignant neoplasm of prostate: Secondary | ICD-10-CM

## 2022-08-17 DIAGNOSIS — E7800 Pure hypercholesterolemia, unspecified: Secondary | ICD-10-CM

## 2022-08-17 DIAGNOSIS — E7212 Methylenetetrahydrofolate reductase deficiency: Secondary | ICD-10-CM | POA: Insufficient documentation

## 2022-08-17 DIAGNOSIS — Z0000 Encounter for general adult medical examination without abnormal findings: Secondary | ICD-10-CM | POA: Diagnosis not present

## 2022-08-17 DIAGNOSIS — Z130 Encounter for screening for diseases of the blood and blood-forming organs and certain disorders involving the immune mechanism: Secondary | ICD-10-CM | POA: Diagnosis not present

## 2022-08-17 LAB — COMPREHENSIVE METABOLIC PANEL
ALT: 28 U/L (ref 0–53)
AST: 23 U/L (ref 0–37)
Albumin: 4.4 g/dL (ref 3.5–5.2)
Alkaline Phosphatase: 65 U/L (ref 39–117)
BUN: 13 mg/dL (ref 6–23)
CO2: 27 mEq/L (ref 19–32)
Calcium: 9.4 mg/dL (ref 8.4–10.5)
Chloride: 101 mEq/L (ref 96–112)
Creatinine, Ser: 0.8 mg/dL (ref 0.40–1.50)
GFR: 94.38 mL/min (ref >60.00)
Glucose, Bld: 85 mg/dL (ref 70–99)
Potassium: 4.5 mEq/L (ref 3.5–5.1)
Sodium: 140 mEq/L (ref 135–145)
Total Bilirubin: 0.8 mg/dL (ref 0.2–1.2)
Total Protein: 7.1 g/dL (ref 6.0–8.3)

## 2022-08-17 LAB — CBC
HCT: 42.5 % (ref 39.0–52.0)
Hemoglobin: 14.6 g/dL (ref 13.0–17.0)
MCHC: 34.4 g/dL (ref 30.0–36.0)
MCV: 102.2 fl — ABNORMAL HIGH (ref 78.0–100.0)
Platelets: 286 10*3/uL (ref 150.0–400.0)
RBC: 4.16 Mil/uL — ABNORMAL LOW (ref 4.22–5.81)
RDW: 12.8 % (ref 11.5–15.5)
WBC: 4.4 10*3/uL (ref 4.0–10.5)

## 2022-08-17 LAB — LIPID PANEL
Cholesterol: 199 mg/dL (ref 0–200)
HDL: 108.3 mg/dL (ref >39.00)
LDL Cholesterol: 75 mg/dL (ref 0–99)
NonHDL: 90.2
Total CHOL/HDL Ratio: 2
Triglycerides: 77 mg/dL (ref 0.0–149.0)
VLDL: 15.4 mg/dL (ref 0.0–40.0)

## 2022-08-17 LAB — PSA: PSA: 1.29 ng/mL (ref 0.10–4.00)

## 2022-08-17 LAB — HEMOGLOBIN A1C: Hgb A1c MFr Bld: 5.5 % (ref 4.6–6.5)

## 2022-08-17 LAB — TSH: TSH: 2.42 u[IU]/mL (ref 0.35–5.50)

## 2022-08-17 NOTE — Assessment & Plan Note (Signed)
Non pharmacologic treatment to continue for now. Further recommendations will be given according to 10 years CVD risk score and lipid panel numbers.  

## 2022-08-17 NOTE — Patient Instructions (Addendum)
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 Do not use My Chart to request refills or for acute issues that need immediate attention.   Please be sure medication list is accurate. If a new problem present, please set up appointment sooner than planned today.  Health Maintenance, Male Adopting a healthy lifestyle and getting preventive care are important in promoting health and wellness. Ask your health care provider about: The right schedule for you to have regular tests and exams. Things you can do on your own to prevent diseases and keep yourself healthy. What should I know about diet, weight, and exercise? Eat a healthy diet  Eat a diet that includes plenty of vegetables, fruits, low-fat dairy products, and lean protein. Do not eat a lot of foods that are high in solid fats, added sugars, or sodium. Maintain a healthy weight Body mass index (BMI) is a measurement that can be used to identify possible weight problems. It estimates body fat based on height and weight. Your health care provider can help determine your BMI and help you achieve or maintain a healthy weight. Get regular exercise Get regular exercise. This is one of the most important things you can do for your health. Most adults should: Exercise for at least 150 minutes each week. The exercise should increase your heart rate and make you sweat (moderate-intensity exercise). Do strengthening exercises at least twice a week. This is in addition  to the moderate-intensity exercise. Spend less time sitting. Even light physical activity can be beneficial. Watch cholesterol and blood lipids Have your blood tested for lipids and cholesterol at 63 years of age, then have this test every 5 years. You may need to have your cholesterol levels checked more often if: Your lipid or cholesterol levels are high. You are older than 63 years of age. You are at high risk for heart disease. What should I know about cancer screening? Many types of cancers can be detected early and may often be prevented. Depending on your health history and family history, you may need to have cancer screening at various ages. This may include screening for: Colorectal cancer. Prostate cancer. Skin cancer. Lung cancer. What should I know about heart disease, diabetes, and high blood pressure? Blood pressure and heart disease High blood pressure causes heart disease and increases the risk of stroke. This is more likely to develop in people who have high blood pressure readings or are overweight. Talk with your health care provider about your target blood pressure readings. Have your blood pressure checked: Every 3-5 years if you are 26-54 years of age. Every year if you are 55 years old or older. If you are between the ages of 60 and 74 and are a current or former smoker, ask your health care provider if you should have a one-time screening for abdominal aortic aneurysm (AAA). Diabetes Have regular diabetes screenings. This checks your fasting blood sugar level. Have the screening done: Once every three years after age 41 if you are at a normal weight and have a low risk for diabetes.  More often and at a younger age if you are overweight or have a high risk for diabetes. What should I know about preventing infection? Hepatitis B If you have a higher risk for hepatitis B, you should be screened for this virus. Talk with your health care provider to find out if you  are at risk for hepatitis B infection. Hepatitis C Blood testing is recommended for: Everyone born from [birth year] through 1967. Anyone with known risk factors for hepatitis C. Sexually transmitted infections (STIs) You should be screened each year for STIs, including gonorrhea and chlamydia, if: You are sexually active and are younger than 63 years of age. You are older than 63 years of age and your health care provider tells you that you are at risk for this type of infection. Your sexual activity has changed since you were last screened, and you are at increased risk for chlamydia or gonorrhea. Ask your health care provider if you are at risk. Ask your health care provider about whether you are at high risk for HIV. Your health care provider may recommend a prescription medicine to help prevent HIV infection. If you choose to take medicine to prevent HIV, you should first get tested for HIV. You should then be tested every 3 months for as long as you are taking the medicine. Follow these instructions at home: Alcohol use Do not drink alcohol if your health care provider tells you not to drink. If you drink alcohol: Limit how much you have to 0-2 drinks a day. Know how much alcohol is in your drink. In the [place] one drink equals one 12 oz bottle of beer (355 mL), one 5 oz glass of wine (148 mL), or one 1 oz glass of hard liquor (44 mL). Lifestyle Do not use any products that contain nicotine or tobacco. These products include cigarettes, chewing tobacco, and vaping devices, such as e-cigarettes. If you need help quitting, ask your health care provider. Do not use street drugs. Do not share needles. Ask your health care provider for help if you need support or information about quitting drugs. General instructions Schedule regular health, dental, and eye exams. Stay current with your vaccines. Tell your health care provider if: You often feel depressed. You have ever been abused or do not feel  safe at home. Summary Adopting a healthy lifestyle and getting preventive care are important in promoting health and wellness. Follow your health care provider's instructions about healthy diet, exercising, and getting tested or screened for diseases. Follow your health care provider's instructions on monitoring your cholesterol and blood pressure. This information is not intended to replace advice given to you by your health care provider. Make sure you discuss any questions you have with your health care provider. Document Revised: 05/04/2021 Document Reviewed: 05/04/2021 Elsevier Patient Education  [place].

## 2022-08-17 NOTE — Assessment & Plan Note (Signed)
Continue folic acid supplementation. CBC ordered today.

## 2022-08-17 NOTE — Assessment & Plan Note (Addendum)
We discussed the importance of regular physical activity and healthy diet for prevention of chronic illness and/or complications. Preventive guidelines reviewed. Vaccination up to date. Next CPE in a year. The ASCVD Risk score (Arnett DK, et al., 2019) failed to calculate for the following reasons:   The valid HDL cholesterol range is 20 to 100 mg/dL

## 2022-09-03 ENCOUNTER — Encounter: Payer: Self-pay | Admitting: Family Medicine

## 2022-09-10 ENCOUNTER — Ambulatory Visit (INDEPENDENT_AMBULATORY_CARE_PROVIDER_SITE_OTHER): Payer: BC Managed Care – PPO

## 2022-09-10 DIAGNOSIS — Z23 Encounter for immunization: Secondary | ICD-10-CM | POA: Diagnosis not present

## 2023-07-25 DIAGNOSIS — H04212 Epiphora due to excess lacrimation, left lacrimal gland: Secondary | ICD-10-CM | POA: Diagnosis not present

## 2023-07-27 ENCOUNTER — Encounter (INDEPENDENT_AMBULATORY_CARE_PROVIDER_SITE_OTHER): Payer: Self-pay

## 2023-08-10 DIAGNOSIS — H04212 Epiphora due to excess lacrimation, left lacrimal gland: Secondary | ICD-10-CM | POA: Diagnosis not present

## 2023-08-17 NOTE — Progress Notes (Signed)
HPI: [name] is a 62 y.o.male with PMHx significant for hyperlipidemia, alcohol dependency uncomplicated, and hearing loss here today for his routine physical examination.  Last CPE: 08/17/22  He reports no new health concerns since his previous visit.  He maintains an active lifestyle, engaging in regular exercise by walking nine miles daily.  His dietary habits are primarily consisting of chicken and fish, with minimal consumption of red meat.  He reports adequate sleep, averaging eight to nine hours per night.  While the patient is a non-smoker, he acknowledges consuming four to six ounces of bourbon nightly.  He denies experiencing any symptoms of depression, feelings of worthlessness, or hopelessness.  Immunization History  Administered Date(s) Administered   Influenza Whole 09/15/2008   Influenza, Seasonal, Injecte, Preservative Fre 09/28/2015   Influenza,inj,Quad PF,6+ Mos 09/26/2016, 09/09/2017, 10/02/2018, 08/25/2019   Influenza-Unspecified 09/26/2018, 09/22/2022   Moderna Sars-Covid-2 Vaccination 03/14/2020, 04/15/2020, 11/04/2020, 04/20/2021   PFIZER Comirnaty(Gray Top)Covid-19 Tri-Sucrose Vaccine 09/22/2022   Pfizer Covid-19 Vaccine Bivalent Booster 5y-11y 09/18/2021   Td 12/27/2001   Tdap 04/05/2013, 09/10/2022   Zoster Recombinant(Shingrix) 07/03/2019, 09/05/2019   Health Maintenance  Topic Date Due   INFLUENZA VACCINE  08/24/2023 (Originally 07/28/2023)   COVID-19 Vaccine (7 - 2023-24 season) 09/04/2023 (Originally 11/17/2022)   HIV Screening  08/04/2029 (Originally 06/01/1976)   Colonoscopy  10/31/2024   DTaP/Tdap/Td (4 - Td or Tdap) 09/10/2032   Hepatitis C Screening  Completed   Zoster Vaccines- Shingrix  Completed   HPV VACCINES  Aged Out   Last prostate ca screening: Nocturia x 1. Negative for dysuria or gross hematuria. History of microscopic hematuria.  Lab Results  Component Value Date   PSA 1.29 08/17/2022   PSA 1.28 08/04/2021   PSA 0.9  07/31/2020   HLD on non pharmacologic treatment.  Lab Results  Component Value Date   CHOL 199 08/17/2022   HDL 108.30 08/17/2022   LDLCALC 75 08/17/2022   TRIG 77.0 08/17/2022   CHOLHDL 2 08/17/2022   Follows with dermatologist annually, he has an appointment next week.  Review of Systems  Constitutional:  Negative for activity change, appetite change and fever.  HENT:  Negative for mouth sores, nosebleeds, sore throat and trouble swallowing.   Eyes:  Negative for redness and visual disturbance.  Respiratory:  Negative for cough, shortness of breath and wheezing.   Cardiovascular:  Negative for chest pain, palpitations and leg swelling.  Gastrointestinal:  Negative for abdominal pain, blood in stool, nausea and vomiting.  Endocrine: Negative for cold intolerance, heat intolerance, polydipsia, polyphagia and polyuria.  Genitourinary:  Negative for decreased urine volume, dysuria, genital sores, hematuria and testicular pain.  Musculoskeletal:  Negative for gait problem and myalgias.  Skin:  Negative for color change and rash.  Allergic/Immunologic: Negative for environmental allergies.  Neurological:  Negative for syncope, weakness, numbness and headaches.  Hematological:  Negative for adenopathy. Does not bruise/bleed easily.  Psychiatric/Behavioral:  Negative for behavioral problems, confusion and sleep disturbance.   All other systems reviewed and are negative.  Current Outpatient Medications on File Prior to Visit  Medication Sig Dispense Refill   Levomefolic Acid (5-MTHF PO) Take 1 capsule by mouth daily.     VITAMIN B COMPLEX-C PO Take by mouth.     No current facility-administered medications on file prior to visit.   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   Microscopic hematuria    Personal  history of colonic polyps - adenoma 06/19/2009   Past Surgical History:  Procedure Laterality Date   BRAIN TUMOR EXCISION  1989   Grade II astrocytoma on 3rd ventricle per pt   COLONOSCOPY     HYDROCELE EXCISION / REPAIR  1991   No Known Allergies  Family History  Problem Relation Age of Onset   Cancer Mother        history of colon cancer in her 38's   Colon cancer Mother 39   Hyperlipidemia Father    Heart attack Father 49       deceased   Other Sister        MTHFR C677T mutation   Esophageal cancer Neg Hx    Rectal cancer Neg Hx    Stomach cancer Neg Hx    Social History   Socioeconomic History   Marital status: Married    Spouse name: Not on file   Number of children: 2   Years of education: Not on file   Highest education level: Not on file  Occupational History    Employer: [employer]  Tobacco Use   Smoking status: Never   Smokeless tobacco: Never  Vaping Use   Vaping status: Never Used  Substance and Sexual Activity   Alcohol use: Yes    Alcohol/week: 42.0 standard drinks of alcohol    Types: 42 Standard drinks or equivalent per week    Comment: has about 5 drinks a day   Drug use: No   Sexual activity: Not on file  Other Topics Concern   Not on file  Social History Narrative   Married   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   Son born 2002 (elon)   Daughter- 1997 (working on her masters in Estate manager/land agent)   Completed doctoral degree in Forensic scientist   Has a dog   Social Determinants of Corporate investment banker Strain: Not on [employer] Insecurity: Not on file  Transportation Needs: Not on file  Physical Activity: Not on file  Stress: Not on file  Social Connections: Not on file   Vitals:   08/19/23 0759 08/19/23 0833  BP: 132/82   Pulse: (!) 59 60  Resp: 16   Temp: 98 F (36.7 C)   SpO2: 99%    Body mass index is 20.75 kg/m.  Wt Readings from  Last 3 Encounters:  08/19/23 163 lb 6 oz (74.1 kg)  08/17/22 164 lb 6 oz (74.6 kg)  08/04/21 167 lb 2 oz (75.8 kg)   Physical Exam Vitals and nursing note reviewed.  Constitutional:      General: He is not in acute distress.    Appearance: He is well-developed.  HENT:     Head: Normocephalic and atraumatic.     Left Ear: Tympanic membrane, ear canal and external ear normal.     Ears:     Comments: Right ear canal excess cerumen, could not see TM.    Mouth/Throat:     Mouth: Mucous membranes are moist.     Pharynx: Oropharynx is clear.  Eyes:     Extraocular Movements: Extraocular movements intact.     Conjunctiva/sclera: Conjunctivae normal.     Pupils: Pupils are equal, round, and reactive to light.  Neck:     Thyroid: No  thyroid mass or thyromegaly.  Cardiovascular:     Rate and Rhythm: Normal rate and regular rhythm.     Pulses:          Dorsalis pedis pulses are 2+ on the right side and 2+ on the left side.     Heart sounds: No murmur heard. Pulmonary:     Effort: Pulmonary effort is normal. No respiratory distress.     Breath sounds: Normal breath sounds.  Abdominal:     Palpations: Abdomen is soft. There is no hepatomegaly or mass.     Tenderness: There is no abdominal tenderness.  Genitourinary:    Comments: No concerns. Musculoskeletal:        General: No tenderness.     Cervical back: Normal range of motion.     Comments: No major deformities appreciated and no signs of synovitis.  Lymphadenopathy:     Cervical: No cervical adenopathy.     Upper Body:     Right upper body: No supraclavicular adenopathy.     Left upper body: No supraclavicular adenopathy.  Skin:    General: Skin is warm.     Findings: No erythema.  Neurological:     General: No focal deficit present.     Mental Status: He is alert and oriented to person, place, and time.     Cranial Nerves: No cranial nerve deficit.     Sensory: No sensory deficit.     Gait: Gait normal.     Deep Tendon  Reflexes:     Reflex Scores:      Bicep reflexes are 2+ on the right side and 2+ on the left side.      Patellar reflexes are 2+ on the right side and 2+ on the left side. Psychiatric:        Mood and Affect: Mood and affect normal.   ASSESSMENT AND PLAN:  Mr. Evan was seen today for annual exam.  Diagnoses and all orders for this visit:  Orders Placed This Encounter  Procedures   Comprehensive metabolic panel   Lipid panel   PSA   Lab Results  Component Value Date   NA 141 08/19/2023   CL 102 08/19/2023   K 5.0 08/19/2023   CO2 31 08/19/2023   BUN 11 08/19/2023   CREATININE 0.80 08/19/2023   GFR 93.72 08/19/2023   CALCIUM 9.8 08/19/2023   ALBUMIN 4.4 08/19/2023   GLUCOSE 88 08/19/2023   Lab Results  Component Value Date   ALT 27 08/19/2023   AST 26 08/19/2023   ALKPHOS 71 08/19/2023   BILITOT 0.8 08/19/2023   Lab Results  Component Value Date   PSA 1.38 08/19/2023   PSA 1.29 08/17/2022   PSA 1.28 08/04/2021   Lab Results  Component Value Date   CHOL 234 (H) 08/19/2023   HDL 96.10 08/19/2023   LDLCALC 108 (H) 08/19/2023   TRIG 149.0 08/19/2023   CHOLHDL 2 08/19/2023   The 10-year ASCVD risk score (Arnett DK, et al., 2019) is: 9.4%   Values used to calculate the score:     Age: 50 years     Sex: Male     Is Non-Hispanic African American: No     Diabetic: No     Tobacco smoker: No     Systolic Blood Pressure: 132 mmHg     Is BP treated: No     HDL Cholesterol: 96.1 mg/dL     Total Cholesterol: 234 mg/dL  Routine general medical examination at a  health care facility Assessment & Plan: We discussed the importance of continuing regular physical activity and following a healthful diet for prevention of chronic illness and/or complications. Preventive guidelines reviewed. Caution with high alcohol intake advised, adverse effects discussed. Vaccination is up to date. Next CPE in a year.   Pure hypercholesterolemia Assessment & Plan: Continue  nonpharmacologic treatment. Further recommendation will be given according to lipid panel result.  Orders: -     Comprehensive metabolic panel; Future -     Lipid panel; Future  Prostate cancer screening -     PSA; Future   Return in 1 year (on 08/18/2024) for CPE.  Raydon Chappuis G. [place], MD  [facility]. [place] office.

## 2023-08-19 ENCOUNTER — Encounter: Payer: Self-pay | Admitting: Family Medicine

## 2023-08-19 ENCOUNTER — Ambulatory Visit (INDEPENDENT_AMBULATORY_CARE_PROVIDER_SITE_OTHER): Payer: BC Managed Care – PPO | Admitting: Family Medicine

## 2023-08-19 VITALS — BP 132/82 | HR 60 | Temp 98.0°F | Resp 16 | Ht 74.4 in | Wt 163.4 lb

## 2023-08-19 DIAGNOSIS — E7800 Pure hypercholesterolemia, unspecified: Secondary | ICD-10-CM | POA: Diagnosis not present

## 2023-08-19 DIAGNOSIS — Z0000 Encounter for general adult medical examination without abnormal findings: Secondary | ICD-10-CM

## 2023-08-19 DIAGNOSIS — Z125 Encounter for screening for malignant neoplasm of prostate: Secondary | ICD-10-CM

## 2023-08-19 DIAGNOSIS — Z13228 Encounter for screening for other metabolic disorders: Secondary | ICD-10-CM

## 2023-08-19 LAB — COMPREHENSIVE METABOLIC PANEL
ALT: 27 U/L (ref 0–53)
AST: 26 U/L (ref 0–37)
Albumin: 4.4 g/dL (ref 3.5–5.2)
Alkaline Phosphatase: 71 U/L (ref 39–117)
BUN: 11 mg/dL (ref 6–23)
CO2: 31 mEq/L (ref 19–32)
Calcium: 9.8 mg/dL (ref 8.4–10.5)
Chloride: 102 mEq/L (ref 96–112)
Creatinine, Ser: 0.8 mg/dL (ref 0.40–1.50)
GFR: 93.72 mL/min (ref >60.00)
Glucose, Bld: 88 mg/dL (ref 70–99)
Potassium: 5 mEq/L (ref 3.5–5.1)
Sodium: 141 mEq/L (ref 135–145)
Total Bilirubin: 0.8 mg/dL (ref 0.2–1.2)
Total Protein: 7.5 g/dL (ref 6.0–8.3)

## 2023-08-19 LAB — LIPID PANEL
Cholesterol: 234 mg/dL — ABNORMAL HIGH (ref 0–200)
HDL: 96.1 mg/dL (ref >39.00)
LDL Cholesterol: 108 mg/dL — ABNORMAL HIGH (ref 0–99)
NonHDL: 138.05
Total CHOL/HDL Ratio: 2
Triglycerides: 149 mg/dL (ref 0.0–149.0)
VLDL: 29.8 mg/dL (ref 0.0–40.0)

## 2023-08-19 LAB — PSA: PSA: 1.38 ng/mL (ref 0.10–4.00)

## 2023-08-19 NOTE — Assessment & Plan Note (Signed)
Continue nonpharmacologic treatment. Further recommendation will be given according to lipid panel result. 

## 2023-08-19 NOTE — Assessment & Plan Note (Signed)
We discussed the importance of continuing regular physical activity and following a healthful diet for prevention of chronic illness and/or complications. Preventive guidelines reviewed. Caution with high alcohol intake advised, adverse effects discussed. Vaccination is up to date. Next CPE in a year.

## 2023-08-19 NOTE — Patient Instructions (Addendum)
A few things to remember from today's visit:  Routine general medical examination at a health care facility  Pure hypercholesterolemia - Plan: Comprehensive metabolic panel, Lipid panel  Prostate cancer screening - Plan: PSA  Do not use My Chart to request refills or for acute issues that need immediate attention. If you send a my chart message, it may take a few days to be addressed, specially if I am not in the office.  Please be sure medication list is accurate. If a new problem present, please set up appointment sooner than planned today.  Health Maintenance, Male Adopting a healthy lifestyle and getting preventive care are important in promoting health and wellness. Ask your health care provider about: The right schedule for you to have regular tests and exams. Things you can do on your own to prevent diseases and keep yourself healthy. What should I know about diet, weight, and exercise? Eat a healthy diet  Eat a diet that includes plenty of vegetables, fruits, low-fat dairy products, and lean protein. Do not eat a lot of foods that are high in solid fats, added sugars, or sodium. Maintain a healthy weight Body mass index (BMI) is a measurement that can be used to identify possible weight problems. It estimates body fat based on height and weight. Your health care provider can help determine your BMI and help you achieve or maintain a healthy weight. Get regular exercise Get regular exercise. This is one of the most important things you can do for your health. Most adults should: Exercise for at least 150 minutes each week. The exercise should increase your heart rate and make you sweat (moderate-intensity exercise). Do strengthening exercises at least twice a week. This is in addition to the moderate-intensity exercise. Spend less time sitting. Even light physical activity can be beneficial. Watch cholesterol and blood lipids Have your blood tested for lipids and cholesterol at  64 years of age, then have this test every 5 years. You may need to have your cholesterol levels checked more often if: Your lipid or cholesterol levels are high. You are older than 64 years of age. You are at high risk for heart disease. What should I know about cancer screening? Many types of cancers can be detected early and may often be prevented. Depending on your health history and family history, you may need to have cancer screening at various ages. This may include screening for: Colorectal cancer. Prostate cancer. Skin cancer. Lung cancer. What should I know about heart disease, diabetes, and high blood pressure? Blood pressure and heart disease High blood pressure causes heart disease and increases the risk of stroke. This is more likely to develop in people who have high blood pressure readings or are overweight. Talk with your health care provider about your target blood pressure readings. Have your blood pressure checked: Every 3-5 years if you are 64-20 years of age. Every year if you are 64 years old or older. If you are between the ages of 64 and 69 and are a current or former smoker, ask your health care provider if you should have a one-time screening for abdominal aortic aneurysm (AAA). Diabetes Have regular diabetes screenings. This checks your fasting blood sugar level. Have the screening done: Once every three years after age 38 if you are at a normal weight and have a low risk for diabetes. More often and at a younger age if you are overweight or have a high risk for diabetes. What should I know about  preventing infection? Hepatitis B If you have a higher risk for hepatitis B, you should be screened for this virus. Talk with your health care provider to find out if you are at risk for hepatitis B infection. Hepatitis C Blood testing is recommended for: Everyone born from [birth year] through 1967. Anyone with known risk factors for hepatitis C. Sexually transmitted  infections (STIs) You should be screened each year for STIs, including gonorrhea and chlamydia, if: You are sexually active and are younger than 64 years of age. You are older than 64 years of age and your health care provider tells you that you are at risk for this type of infection. Your sexual activity has changed since you were last screened, and you are at increased risk for chlamydia or gonorrhea. Ask your health care provider if you are at risk. Ask your health care provider about whether you are at high risk for HIV. Your health care provider may recommend a prescription medicine to help prevent HIV infection. If you choose to take medicine to prevent HIV, you should first get tested for HIV. You should then be tested every 3 months for as long as you are taking the medicine. Follow these instructions at home: Alcohol use Do not drink alcohol if your health care provider tells you not to drink. If you drink alcohol: Limit how much you have to 0-2 drinks a day. Know how much alcohol is in your drink. In the [place] one drink equals one 12 oz bottle of beer (355 mL), one 5 oz glass of wine (148 mL), or one 1 oz glass of hard liquor (44 mL). Lifestyle Do not use any products that contain nicotine or tobacco. These products include cigarettes, chewing tobacco, and vaping devices, such as e-cigarettes. If you need help quitting, ask your health care provider. Do not use street drugs. Do not share needles. Ask your health care provider for help if you need support or information about quitting drugs. General instructions Schedule regular health, dental, and eye exams. Stay current with your vaccines. Tell your health care provider if: You often feel depressed. You have ever been abused or do not feel safe at home. Summary Adopting a healthy lifestyle and getting preventive care are important in promoting health and wellness. Follow your health care provider's instructions about healthy  diet, exercising, and getting tested or screened for diseases. Follow your health care provider's instructions on monitoring your cholesterol and blood pressure. This information is not intended to replace advice given to you by your health care provider. Make sure you discuss any questions you have with your health care provider. Document Revised: 05/04/2021 Document Reviewed: 05/04/2021 Elsevier Patient Education  2024 [employer].

## 2024-07-09 DIAGNOSIS — L821 Other seborrheic keratosis: Secondary | ICD-10-CM | POA: Diagnosis not present

## 2024-07-09 DIAGNOSIS — L578 Other skin changes due to chronic exposure to nonionizing radiation: Secondary | ICD-10-CM | POA: Diagnosis not present

## 2024-07-09 DIAGNOSIS — L814 Other melanin hyperpigmentation: Secondary | ICD-10-CM | POA: Diagnosis not present

## 2024-07-09 DIAGNOSIS — D225 Melanocytic nevi of trunk: Secondary | ICD-10-CM | POA: Diagnosis not present

## 2024-07-19 DIAGNOSIS — H5213 Myopia, bilateral: Secondary | ICD-10-CM | POA: Diagnosis not present

## 2024-08-20 ENCOUNTER — Encounter: Payer: Self-pay | Admitting: Family Medicine

## 2024-08-20 ENCOUNTER — Ambulatory Visit: Payer: BC Managed Care – PPO | Admitting: Family Medicine

## 2024-08-20 VITALS — BP 134/80 | HR 60 | Temp 97.7°F | Resp 16 | Ht 74.4 in | Wt 164.0 lb

## 2024-08-20 DIAGNOSIS — Z0189 Encounter for other specified special examinations: Secondary | ICD-10-CM

## 2024-08-20 DIAGNOSIS — E7800 Pure hypercholesterolemia, unspecified: Secondary | ICD-10-CM

## 2024-08-20 DIAGNOSIS — Z0000 Encounter for general adult medical examination without abnormal findings: Secondary | ICD-10-CM

## 2024-08-20 DIAGNOSIS — D3A8 Other benign neuroendocrine tumors: Secondary | ICD-10-CM | POA: Diagnosis not present

## 2024-08-20 DIAGNOSIS — Z125 Encounter for screening for malignant neoplasm of prostate: Secondary | ICD-10-CM | POA: Diagnosis not present

## 2024-08-20 DIAGNOSIS — Z23 Encounter for immunization: Secondary | ICD-10-CM | POA: Diagnosis not present

## 2024-08-20 LAB — COMPREHENSIVE METABOLIC PANEL WITH GFR
ALT: 31 U/L (ref 0–53)
AST: 27 U/L (ref 0–37)
Albumin: 4.6 g/dL (ref 3.5–5.2)
Alkaline Phosphatase: 68 U/L (ref 39–117)
BUN: 12 mg/dL (ref 6–23)
CO2: 26 meq/L (ref 19–32)
Calcium: 9.7 mg/dL (ref 8.4–10.5)
Chloride: 100 meq/L (ref 96–112)
Creatinine, Ser: 0.79 mg/dL (ref 0.40–1.50)
GFR: 93.42 mL/min (ref >60.00)
Glucose, Bld: 93 mg/dL (ref 70–99)
Potassium: 4.7 meq/L (ref 3.5–5.1)
Sodium: 140 meq/L (ref 135–145)
Total Bilirubin: 0.9 mg/dL (ref 0.2–1.2)
Total Protein: 7.7 g/dL (ref 6.0–8.3)

## 2024-08-20 LAB — LIPID PANEL
Cholesterol: 215 mg/dL — ABNORMAL HIGH (ref 0–200)
HDL: 105.1 mg/dL (ref >39.00)
LDL Cholesterol: 90 mg/dL (ref 0–99)
NonHDL: 109.94
Total CHOL/HDL Ratio: 2
Triglycerides: 99 mg/dL (ref 0.0–149.0)
VLDL: 19.8 mg/dL (ref 0.0–40.0)

## 2024-08-20 LAB — PSA: PSA: 1.56 ng/mL (ref 0.10–4.00)

## 2024-08-20 NOTE — Assessment & Plan Note (Signed)
 We discussed the importance of regular physical activity and healthy diet for prevention of chronic illness and/or complications. Preventive guidelines reviewed. Vaccination updated, Prevnar 20 given today. Encouraged to decrease alcohol intake. Next CPE in a year.

## 2024-08-20 NOTE — Patient Instructions (Addendum)
 A few things to remember from today's visit:  Routine general medical examination at a health care facility  Pure hypercholesterolemia  Prostate cancer screening  Please arrange appt with audiologist, let me know if a referral is needed. Colonoscopy due in 10/2024.  If you need refills for medications you take chronically, please call your pharmacy. Do not use My Chart to request refills or for acute issues that need immediate attention. If you send a my chart message, it may take a few days to be addressed, specially if I am not in the office.  Please be sure medication list is accurate. If a new problem present, please set up appointment sooner than planned today.  Health Maintenance, Male Adopting a healthy lifestyle and getting preventive care are important in promoting health and wellness. Ask your health care provider about: The right schedule for you to have regular tests and exams. Things you can do on your own to prevent diseases and keep yourself healthy. What should I know about diet, weight, and exercise? Eat a healthy diet  Eat a diet that includes plenty of vegetables, fruits, low-fat dairy products, and lean protein. Do not eat a lot of foods that are high in solid fats, added sugars, or sodium. Maintain a healthy weight Body mass index (BMI) is a measurement that can be used to identify possible weight problems. It estimates body fat based on height and weight. Your health care provider can help determine your BMI and help you achieve or maintain a healthy weight. Get regular exercise Get regular exercise. This is one of the most important things you can do for your health. Most adults should: Exercise for at least 150 minutes each week. The exercise should increase your heart rate and make you sweat (moderate-intensity exercise). Do strengthening exercises at least twice a week. This is in addition to the moderate-intensity exercise. Spend less time sitting. Even  light physical activity can be beneficial. Watch cholesterol and blood lipids Have your blood tested for lipids and cholesterol at 65 years of age, then have this test every 5 years. You may need to have your cholesterol levels checked more often if: Your lipid or cholesterol levels are high. You are older than 65 years of age. You are at high risk for heart disease. What should I know about cancer screening? Many types of cancers can be detected early and may often be prevented. Depending on your health history and family history, you may need to have cancer screening at various ages. This may include screening for: Colorectal cancer. Prostate cancer. Skin cancer. Lung cancer. What should I know about heart disease, diabetes, and high blood pressure? Blood pressure and heart disease High blood pressure causes heart disease and increases the risk of stroke. This is more likely to develop in people who have high blood pressure readings or are overweight. Talk with your health care provider about your target blood pressure readings. Have your blood pressure checked: Every 3-5 years if you are 27-32 years of age. Every year if you are 34 years old or older. If you are between the ages of 69 and 75 and are a current or former smoker, ask your health care provider if you should have a one-time screening for abdominal aortic aneurysm (AAA). Diabetes Have regular diabetes screenings. This checks your fasting blood sugar level. Have the screening done: Once every three years after age 37 if you are at a normal weight and have a low risk for diabetes. More often  and at a younger age if you are overweight or have a high risk for diabetes. What should I know about preventing infection? Hepatitis B If you have a higher risk for hepatitis B, you should be screened for this virus. Talk with your health care provider to find out if you are at risk for hepatitis B infection. Hepatitis C Blood testing is  recommended for: Everyone born from [birth year] through 1967. Anyone with known risk factors for hepatitis C. Sexually transmitted infections (STIs) You should be screened each year for STIs, including gonorrhea and chlamydia, if: You are sexually active and are younger than 65 years of age. You are older than 65 years of age and your health care provider tells you that you are at risk for this type of infection. Your sexual activity has changed since you were last screened, and you are at increased risk for chlamydia or gonorrhea. Ask your health care provider if you are at risk. Ask your health care provider about whether you are at high risk for HIV. Your health care provider may recommend a prescription medicine to help prevent HIV infection. If you choose to take medicine to prevent HIV, you should first get tested for HIV. You should then be tested every 3 months for as long as you are taking the medicine. Follow these instructions at home: Alcohol use Do not drink alcohol if your health care provider tells you not to drink. If you drink alcohol: Limit how much you have to 0-2 drinks a day. Know how much alcohol is in your drink. In the [place] one drink equals one 12 oz bottle of beer (355 mL), one 5 oz glass of wine (148 mL), or one 1 oz glass of hard liquor (44 mL). Lifestyle Do not use any products that contain nicotine or tobacco. These products include cigarettes, chewing tobacco, and vaping devices, such as e-cigarettes. If you need help quitting, ask your health care provider. Do not use street drugs. Do not share needles. Ask your health care provider for help if you need support or information about quitting drugs. General instructions Schedule regular health, dental, and eye exams. Stay current with your vaccines. Tell your health care provider if: You often feel depressed. You have ever been abused or do not feel safe at home. Summary Adopting a healthy lifestyle and getting  preventive care are important in promoting health and wellness. Follow your health care provider's instructions about healthy diet, exercising, and getting tested or screened for diseases. Follow your health care provider's instructions on monitoring your cholesterol and blood pressure. This information is not intended to replace advice given to you by your health care provider. Make sure you discuss any questions you have with your health care provider. Document Revised: 05/04/2021 Document Reviewed: 05/04/2021 Elsevier Patient Education  2024 [employer].

## 2024-08-20 NOTE — Assessment & Plan Note (Signed)
 Currently on non pharmacologic treatment, has declined statin med in the past. Continue non pharmacologic treatment, further recommendations will be given according to lipid panel result.

## 2024-08-20 NOTE — Progress Notes (Unsigned)
 HPI: [name] is a 63 y.o.male with PMHx significant for hyperlipidemia, alcohol dependency uncomplicated, and hearing loss, here today for his routine physical examination.  Last CPE: 08/19/23 Since his last visit he has followed up with dermatology in 06/2024.   Exercise: Walking 9 miles per day. Planning to rejoining the gym and Silver Sneakers.  Diet: Decreased cheese, white bread, processed lunch meat. Cut back on rice and is eating more quinoa. Eating less red meat. Added more beans, fruits, and vegetables to his diet.   Smoking: Never. Alcohol consumption: has ~4 drinks of bourbon per day. Has decreased alcohol intake by 30%. He denies any withdrawal symptoms when not drinking.   Dental: UTD with routine dental care; last f/u in 06/2024 Vision: UTD with routine vision exams, last f/u in 06/2024  Immunization History  Administered Date(s) Administered   Influenza Whole 09/15/2008   Influenza, Seasonal, Injecte, Preservative Fre 09/28/2015   Influenza,inj,Quad PF,6+ Mos 09/26/2016, 09/09/2017, 10/02/2018, 08/25/2019   Influenza-Unspecified 09/26/2018, 09/22/2022   Moderna Sars-Covid-2 Vaccination 03/14/2020, 04/15/2020, 11/04/2020, 04/20/2021   PFIZER Comirnaty(Gray Top)Covid-19 Tri-Sucrose Vaccine 09/22/2022   PNEUMOCOCCAL CONJUGATE-20 08/20/2024   Pfizer Covid-19 Vaccine Bivalent Booster 5y-11y 09/18/2021   Td 12/27/2001   Tdap 04/05/2013, 09/10/2022   Zoster Recombinant(Shingrix ) 07/03/2019, 09/05/2019   Health Maintenance  Topic Date Due   Medicare Annual Wellness (AWV)  Never done   Colonoscopy  10/31/2024   INFLUENZA VACCINE  08/31/2024 (Originally 07/27/2024)   COVID-19 Vaccine (7 - 2024-25 season) 09/05/2024 (Originally 08/28/2023)   HIV Screening  08/04/2029 (Originally 06/01/1976)   DTaP/Tdap/Td (4 - Td or Tdap) 09/10/2032   Pneumococcal Vaccine: 50+ Years  Completed   Hepatitis C Screening  Completed   Zoster Vaccines- Shingrix   Completed   Hepatitis B  Vaccines 19-59 Average Risk  Aged Out   HPV VACCINES  Aged Out   Meningococcal B Vaccine  Aged Out   Chronic medical problems:   Hyperlipidemia Not currently on pharmacologic treatment.  Following a low fat diet and reports making healthier diet and lifestyle choices.  Has been cutting back on processed foods and cheeses.   Lab Results  Component Value Date   CHOL 234 (H) 08/19/2023   HDL 96.10 08/19/2023   LDLCALC 108 (H) 08/19/2023   TRIG 149.0 08/19/2023   CHOLHDL 2 08/19/2023   Lab Results  Component Value Date   ALT 27 08/19/2023   AST 26 08/19/2023   ALKPHOS 71 08/19/2023   BILITOT 0.8 08/19/2023   He would like some labs added to blood work today: Homocysteine, Lipoprotein a, HgA1C, GGT. Lab Results  Component Value Date   ALT 27 08/19/2023   AST 26 08/19/2023   ALKPHOS 71 08/19/2023   BILITOT 0.8 08/19/2023   He is currently taking a multivitamin, folate, and vit B12 supplementation.   The following acute concerns were addressed today: Hearing loss Pt reports gradual hearing loss; recently worsening. Has difficulty hearing people in a noisy room with several people talking at once.   Last prostate ca screening: No changes in urinary frequency.  Lab Results  Component Value Date   PSA 1.38 08/19/2023   PSA 1.29 08/17/2022   PSA 1.28 08/04/2021   Review of Systems  Constitutional:  Negative for activity change, appetite change and fever.  HENT:  Positive for hearing loss. Negative for nosebleeds, sore throat and trouble swallowing.   Eyes:  Negative for redness and visual disturbance.  Respiratory:  Negative for cough, shortness of breath and wheezing.  Cardiovascular:  Negative for chest pain, palpitations and leg swelling.  Gastrointestinal:  Negative for abdominal pain, blood in stool, nausea and vomiting.  Endocrine: Negative for cold intolerance, heat intolerance, polydipsia, polyphagia and polyuria.  Genitourinary:  Negative for decreased urine  volume, dysuria, genital sores, hematuria and testicular pain.  Musculoskeletal:  Negative for gait problem and myalgias.  Skin:  Negative for color change and rash.  Allergic/Immunologic: Negative for environmental allergies.  Neurological:  Negative for syncope, weakness and headaches.  Hematological:  Negative for adenopathy. Does not bruise/bleed easily.  Psychiatric/Behavioral:  Negative for confusion. The patient is not nervous/anxious.    Current Outpatient Medications on File Prior to Visit  Medication Sig Dispense Refill   Levomefolic Acid (5-MTHF PO) Take 1 capsule by mouth daily.     VITAMIN B COMPLEX-C PO Take by mouth.     No current facility-administered medications on file prior to visit.   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   COLONOSCOPY     HYDROCELE EXCISION / REPAIR  1991   No Known Allergies  Family History  Problem Relation Age of Onset   Cancer Mother        history of colon cancer in her 69's   Colon cancer Mother 68   Hyperlipidemia Father    Heart attack Father 5       deceased   Heart disease Father    Other Sister        MTHFR C677T mutation   Esophageal cancer Neg Hx    Rectal cancer Neg Hx    Stomach cancer Neg Hx     Social History   Socioeconomic History   Marital status: Married    Spouse name: Not on file   Number of children: 2   Years of education: Not on file   Highest education level: Doctorate  Occupational History    Employer: [employer]  Tobacco Use   Smoking status: Never   Smokeless tobacco: Never  Vaping Use    Vaping status: Never Used  Substance and Sexual Activity   Alcohol use: Yes    Alcohol/week: 4.0 standard drinks of alcohol    Types: 4 Shots of liquor per week    Comment: has about 5 drinks a day   Drug use: No   Sexual activity: Not on file  Other Topics Concern   Not on file  Social History Narrative   Married   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   Son born 2002 (elon)   Daughter- 1997 (working on her masters in Estate manager/land agent)   Completed doctoral degree in Forensic scientist   Has a dog   Social Drivers of Corporate investment banker Strain: Low Risk  (08/19/2024)   Overall Financial Resource Strain (CARDIA)    Difficulty of Paying Living Expenses: Not hard at all  Food Insecurity: No Food Insecurity (08/19/2024)   Hunger Vital Sign    Worried About Running Out of Food in the Last  Year: Never true    Ran Out of Food in the Last Year: Never true  Transportation Needs: No Transportation Needs (08/19/2024)   PRAPARE - Administrator, Civil Service (Medical): No    Lack of Transportation (Non-Medical): No  Physical Activity: Sufficiently Active (08/19/2024)   Exercise Vital Sign    Days of Exercise per Week: 7 days    Minutes of Exercise per Session: 120 min  Stress: No Stress Concern Present (08/19/2024)   [employer] of Occupational Health - Occupational Stress Questionnaire    Feeling of Stress: Not at all  Social Connections: Moderately Integrated (08/19/2024)   Social Connection and Isolation Panel    Frequency of Communication with Friends and Family: Once a week    Frequency of Social Gatherings with Friends and Family: Once a week    Attends Religious Services: More than 4 times per year    Active Member of [employer] or Organizations: Yes    Attends Banker Meetings: More than 4 times per year    Marital Status: Married   Vitals:   08/20/24 0752  BP: 134/80  Pulse:  60  Resp: 16  Temp: 97.7 F (36.5 C)  SpO2: 98%   Body mass index is 20.83 kg/m.  Wt Readings from Last 3 Encounters:  08/20/24 164 lb (74.4 kg)  08/19/23 163 lb 6 oz (74.1 kg)  08/17/22 164 lb 6 oz (74.6 kg)   Physical Exam Vitals and nursing note reviewed.  Constitutional:      General: He is not in acute distress.    Appearance: He is well-developed.  HENT:     Head: Normocephalic and atraumatic.     Right Ear: External ear normal.     Left Ear: Tympanic membrane, ear canal and external ear normal.     Ears:     Comments: Right ear canal excess cerumen, could not see TM.    Mouth/Throat:     Mouth: Mucous membranes are moist.     Pharynx: Oropharynx is clear.  Eyes:     Extraocular Movements: Extraocular movements intact.     Conjunctiva/sclera: Conjunctivae normal.     Pupils: Pupils are equal, round, and reactive to light.  Neck:     Thyroid : No thyroid  mass or thyromegaly.  Cardiovascular:     Rate and Rhythm: Normal rate and regular rhythm.     Pulses:          Dorsalis pedis pulses are 2+ on the right side and 2+ on the left side.     Heart sounds: No murmur heard. Pulmonary:     Effort: Pulmonary effort is normal. No respiratory distress.     Breath sounds: Normal breath sounds.  Abdominal:     Palpations: Abdomen is soft. There is no hepatomegaly or mass.     Tenderness: There is no abdominal tenderness.  Genitourinary:    Comments: No concerns. Musculoskeletal:        General: No tenderness.     Cervical back: Normal range of motion.     Comments: No major deformities appreciated and no signs of synovitis.  Lymphadenopathy:     Cervical: No cervical adenopathy.     Upper Body:     Right upper body: No supraclavicular adenopathy.     Left upper body: No supraclavicular adenopathy.  Skin:    General: Skin is warm.     Findings: No erythema.  Neurological:     General: No focal deficit present.  Mental Status: He is alert and oriented to person,  place, and time.     Cranial Nerves: No cranial nerve deficit.     Sensory: No sensory deficit.     Motor: No tremor.     Gait: Gait normal.     Deep Tendon Reflexes:     Reflex Scores:      Bicep reflexes are 2+ on the right side and 2+ on the left side.      Patellar reflexes are 2+ on the right side and 2+ on the left side. Psychiatric:        Mood and Affect: Mood and affect normal.   ASSESSMENT AND PLAN:  Jason Arnold was seen today for his routine physical examination.  Orders Placed This Encounter  Procedures   Pneumococcal conjugate vaccine 20-valent (Prevnar 20)   Comprehensive metabolic panel with GFR   PSA   Lipid panel   Lipoprotein A (LPA)   Lab Results  Component Value Date   NA 140 08/20/2024   CL 100 08/20/2024   K 4.7 08/20/2024   CO2 26 08/20/2024   BUN 12 08/20/2024   CREATININE 0.79 08/20/2024   GFR 93.42 08/20/2024   CALCIUM 9.7 08/20/2024   ALBUMIN 4.6 08/20/2024   GLUCOSE 93 08/20/2024   Lab Results  Component Value Date   ALT 31 08/20/2024   AST 27 08/20/2024   ALKPHOS 68 08/20/2024   BILITOT 0.9 08/20/2024   Lab Results  Component Value Date   PSA 1.56 08/20/2024   PSA 1.38 08/19/2023   PSA 1.29 08/17/2022   Lab Results  Component Value Date   CHOL 215 (H) 08/20/2024   HDL 105.10 08/20/2024   LDLCALC 90 08/20/2024   TRIG 99.0 08/20/2024   CHOLHDL 2 08/20/2024   The ASCVD Risk score (Arnett DK, et al., 2019) failed to calculate for the following reasons:   The valid HDL cholesterol range is 20 to 100 mg/dL  Routine general medical examination at a health care facility Assessment & Plan: We discussed the importance of regular physical activity and healthy diet for prevention of chronic illness and/or complications. Preventive guidelines reviewed. Vaccination updated, Prevnar 20 given today. Encouraged to decrease alcohol intake. Next CPE in a year.   Pure hypercholesterolemia Assessment & Plan: Currently on non  pharmacologic treatment, has declined statin med in the past. Continue non pharmacologic treatment, further recommendations will be given according to lipid panel result.  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 Due for colonoscopy in 10/2024.   Prostate cancer screening -     PSA; Future  Patient request for diagnostic testing -     Lipoprotein A (LPA); Future  Need for pneumococcal vaccination -     Pneumococcal conjugate vaccine 20-valent  In regard to hearing loss, it seems to be gradual for the past few years, most likely sensorineural.  He reports having hearing test in the past, mildly abnormal.  So recommend arranging an appointment with audiologist, he will let me know if he needs a referral.  Return in 2 years (on 08/20/2026) for CPE.  I, Vernell Forest, acting as a scribe for Masayo Fera [place], MD., have documented all relevant documentation on the behalf of Jason Beiser [place],  MD, as directed by   while in the presence of Jason Kloepfer [place], MD.  I, Devian Bartolomei [place], MD, have reviewed all documentation for this visit. The documentation on 08/20/24 for the exam, diagnosis, procedures, and orders are all accurate and complete.  Faithlynn Deeley G. [place], MD  [facility]

## 2024-08-20 NOTE — Assessment & Plan Note (Signed)
 Diminutive adenoma + diminutive NET removed in 10/2024. Due for colonoscopy in 10/2024.

## 2024-08-21 ENCOUNTER — Ambulatory Visit: Payer: Self-pay | Admitting: Family Medicine

## 2024-08-24 LAB — LIPOPROTEIN A (LPA): Lipoprotein (a): 15 nmol/L (ref <75)

## 2024-10-22 ENCOUNTER — Encounter: Payer: Self-pay | Admitting: Family Medicine

## 2024-10-22 ENCOUNTER — Ambulatory Visit: Admitting: Family Medicine

## 2024-10-22 VITALS — BP 108/64 | HR 88 | Temp 98.0°F | Resp 16 | Ht 73.47 in | Wt 164.4 lb

## 2024-10-22 DIAGNOSIS — H9193 Unspecified hearing loss, bilateral: Secondary | ICD-10-CM

## 2024-10-22 DIAGNOSIS — Z0000 Encounter for general adult medical examination without abnormal findings: Secondary | ICD-10-CM

## 2024-10-22 DIAGNOSIS — H6121 Impacted cerumen, right ear: Secondary | ICD-10-CM

## 2024-10-22 NOTE — Patient Instructions (Addendum)
  Jason Arnold , Thank you for taking time to come for your Medicare Wellness Visit. I appreciate your ongoing commitment to your health goals. Please review the following plan we discussed and let me know if I can assist you in the future.   These are the goals we discussed:  Goals      Reduce alcohol intake        This is a list of the screening recommended for you and due dates:  Health Maintenance  Topic Date Due   Medicare Annual Wellness Visit  Never done   COVID-19 Vaccine (7 - 2025-26 season) 08/27/2024   Colon Cancer Screening  10/31/2024   HIV Screening  08/04/2029*   DTaP/Tdap/Td vaccine (4 - Td or Tdap) 09/10/2032   Pneumococcal Vaccine for age over 95  Completed   Flu Shot  Completed   Hepatitis C Screening  Completed   Zoster (Shingles) Vaccine  Completed   Hepatitis B Vaccine  Aged Out   Meningitis B Vaccine  Aged Out  *Topic was postponed. The date shown is not the original due date.   A few things to remember from today's visit:  Welcome to Medicare preventive visit - Plan: EKG 12-Lead  Bilateral hearing loss, unspecified hearing loss type - Plan: Ambulatory referral to Audiology  Hearing loss of right ear due to cerumen impaction  Do not use My Chart to request refills or for acute issues that need immediate attention. If you send a my chart message, it may take a few days to be addressed, specially if I am not in the office.  Please be sure medication list is accurate. If a new problem present, please set up appointment sooner than planned today.

## 2024-10-22 NOTE — Progress Notes (Signed)
 Chief Complaint  Patient presents with   Medicare Wellness   Discussed the use of AI scribe software for clinical note transcription with the patient, who gave verbal consent to proceed.  History of Present Illness Jason Arnold is a 63 year old male  with PMHx significant for hyperlipidemia, and hearing loss, here today for his  Welcome to Medicare visit. He was seen for his CPE 08/20/24.  He maintains an active lifestyle, walking approximately nine miles per day, although in October he walked less due to travel. He recently joined a gym and attends two to three times a week.  He does not smoke and consumes about four drinks of bourbon per day.  He has regular annual eye and dental exams and had a dermatology check in July/2025 Bonney Axner PA).  He does not wear glasses for driving but uses reading glasses.  He sees Dr Norleen Hamilton.  No recent falls, but he notes that his balance is not as good as it used to be.   Functional Status Survey: Is the patient deaf or have difficulty hearing?: No Does the patient have difficulty seeing, even when wearing glasses/contacts?: No Does the patient have difficulty concentrating, remembering, or making decisions?: No Does the patient have difficulty walking or climbing stairs?: No Does the patient have difficulty dressing or bathing?: No Does the patient have difficulty doing errands alone such as visiting a doctor's office or shopping?: No     10/22/2024    9:38 AM 10/18/2024    4:31 PM 08/19/2023    8:14 AM 08/17/2022    8:06 AM 08/04/2021    7:53 AM  Fall Risk   Falls in the past year? 0 0 0 0 0  Number falls in past yr: 0 0 0 0   Injury with Fall? 0 0 0 0   Risk for fall due to : Other (Comment)  No Fall Risks No Fall Risks   Follow up Falls evaluation completed Falls evaluation completed;Education provided  Falls evaluation completed       Data saved with a previous flowsheet row definition      10/22/2024   10:04 AM   Depression screen PHQ 2/9  Decreased Interest 0  Down, Depressed, Hopeless 0  PHQ - 2 Score 0    Mini-Cog - 10/22/24 1002     Normal clock drawing test? yes    How many words correct? 3         Hearing Screening   500Hz  1000Hz  2000Hz  4000Hz   Right ear Fail Pass Pass Fail  Left ear Fail Fail Fail Fail   Vision Screening   Right eye Left eye Both eyes  Without correction 20/50-1 20/70 20/30  With correction       Immunization History  Administered Date(s) Administered   Influenza Whole 09/15/2008   Influenza, Seasonal, Injecte, Preservative Fre 09/28/2015   Influenza,inj,Quad PF,6+ Mos 09/26/2016, 09/09/2017, 10/02/2018, 08/25/2019   Influenza-Unspecified 09/26/2018, 09/22/2022   Moderna Sars-Covid-2 Vaccination 03/14/2020, 04/15/2020, 11/04/2020, 04/20/2021   PFIZER Comirnaty(Gray Top)Covid-19 Tri-Sucrose Vaccine 09/22/2022   PNEUMOCOCCAL CONJUGATE-20 08/20/2024   Pfizer Covid-19 Vaccine Bivalent Booster 5y-11y 09/18/2021   Td 12/27/2001   Tdap 04/05/2013, 09/10/2022   Zoster Recombinant(Shingrix ) 07/03/2019, 09/05/2019   Health Maintenance  Topic Date Due   Medicare Annual Wellness (AWV)  Never done   COVID-19 Vaccine (7 - 2025-26 season) 08/27/2024   Colonoscopy  10/31/2024   HIV Screening  08/04/2029 (Originally 06/01/1976)   DTaP/Tdap/Td (4 - Td or  Tdap) 09/10/2032   Pneumococcal Vaccine: 50+ Years  Completed   Influenza Vaccine  Completed   Hepatitis C Screening  Completed   Zoster Vaccines- Shingrix   Completed   Hepatitis B Vaccines 19-59 Average Risk  Aged Out   Meningococcal B Vaccine  Aged Out   Chronic medical problems:   Hyperlipidemia:Not currently on pharmacologic treatment.  Lab Results  Component Value Date   CHOL 215 (H) 08/20/2024   HDL 105.10 08/20/2024   LDLCALC 90 08/20/2024   TRIG 99.0 08/20/2024   CHOLHDL 2 08/20/2024   Lab Results  Component Value Date   ALT 31 08/20/2024   AST 27 08/20/2024   ALKPHOS 68 08/20/2024   BILITOT  0.9 08/20/2024   He has a history of hearing loss, particularly in the left ear, which has been worsening over the years. 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 There is no earache or drainage from the ear. He previously visited an audiologist years ago but does not recall the name.  Review of Systems  Constitutional:  Negative for activity change, appetite change and fever.  HENT:  Negative for ear discharge, ear pain, facial swelling, mouth sores and sore throat.   Eyes:  Negative for redness and visual disturbance.  Respiratory:  Negative for cough, shortness of breath and wheezing.   Cardiovascular:  Negative for chest pain, palpitations and leg swelling.  Gastrointestinal:  Negative for abdominal pain, nausea and vomiting.  Genitourinary:  Negative for decreased urine volume, dysuria and hematuria.  Neurological:  Negative for syncope, weakness and headaches.  See other pertinent positives and negatives in HPI.  Current Outpatient Medications on File Prior to Visit  Medication Sig Dispense Refill   Levomefolic Acid (5-MTHF PO) Take 1 capsule by mouth daily.     VITAMIN B COMPLEX-C PO Take by mouth.     No current facility-administered medications on file prior to visit.   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   COLONOSCOPY     HYDROCELE EXCISION / REPAIR  1991   No Known Allergies  Family History  Problem Relation Age of Onset   Cancer Mother        history of colon cancer in her 65's   Colon cancer Mother 73   Hyperlipidemia Father     Heart attack Father 43       deceased   Heart disease Father    Other Sister        MTHFR C677T mutation   Esophageal cancer Neg Hx    Rectal cancer Neg Hx    Stomach cancer Neg Hx     Social History   Socioeconomic History   Marital status: Married    Spouse name: Not on file   Number of children: 2   Years of education: Not on file   Highest education level: Doctorate  Occupational History    Employer: [employer]  Tobacco Use   Smoking status: Never   Smokeless tobacco: Never  Vaping Use   Vaping status: Never Used  Substance and Sexual Activity   Alcohol use: Yes    Alcohol/week: 4.0 standard drinks of alcohol    Types: 4 Shots of liquor per week    Comment: has about 5 drinks a day   Drug use: No   Sexual activity: Not on file  Other Topics Concern   Not on file  Social History Narrative   Married   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   Son born 2002 (elon)   Daughter- 1997 (working on her masters in estate manager/land agent)   Completed doctoral degree in forensic scientist   Has a dog   Social Drivers of Corporate Investment Banker Strain: Low Risk  (10/18/2024)   Overall Financial Resource Strain (CARDIA)    Difficulty of Paying Living Expenses: Not hard at all  Food Insecurity: No Food Insecurity (10/18/2024)   Hunger Vital Sign    Worried About Running Out of Food in the Last Year: Never true    Ran Out of Food in the Last Year: Never true  Transportation Needs: No Transportation Needs (10/18/2024)   PRAPARE - Administrator, Civil Service (Medical): No    Lack of Transportation (Non-Medical): No  Physical Activity: Sufficiently Active (10/18/2024)   Exercise Vital Sign    Days of Exercise per Week: 7 days    Minutes of Exercise per Session: 140 min  Stress: No Stress Concern Present (10/18/2024)   [employer] of Occupational Health - Occupational Stress  Questionnaire    Feeling of Stress: Only a little  Social Connections: Moderately Integrated (10/18/2024)   Social Connection and Isolation Panel    Frequency of Communication with Friends and Family: Once a week    Frequency of Social Gatherings with Friends and Family: Once a week    Attends Religious Services: More than 4 times per year    Active Member of [employer] or Organizations: Yes    Attends Banker Meetings: More than 4 times per year    Marital Status: Married   Vitals:   10/22/24 0928  BP: 108/64  Pulse: 88  Resp: 16  Temp: 98 F (36.7 C)  SpO2: 98%    Body mass index is 21.42 kg/m.  Wt Readings from Last 3 Encounters:  10/22/24 164 lb 6.4 oz (74.6 kg)  08/20/24 164 lb (74.4 kg)  08/19/23 163 lb 6 oz (74.1 kg)   Physical Exam Vitals and nursing note reviewed.  Constitutional:      General: He is not in acute distress.    Appearance: He is well-developed.  HENT:     Head: Normocephalic and atraumatic.     Right Ear: Tympanic membrane, ear canal and external ear normal. Decreased hearing noted.     Left Ear: External ear normal. Decreased hearing noted.     Ears:     Comments: Right ear canal excess cerumen, could not see TM. Eyes:     Conjunctiva/sclera: Conjunctivae normal.  Cardiovascular:     Rate and Rhythm: Normal rate and regular rhythm.     Pulses:          Dorsalis pedis pulses are 2+ on the right side and 2+ on the left side.     Heart sounds: No murmur heard. Pulmonary:     Effort: Pulmonary effort is normal. No respiratory distress.     Breath sounds: Normal breath sounds.  Abdominal:     Palpations: Abdomen is  soft. There is no mass.     Tenderness: There is no abdominal tenderness.  Lymphadenopathy:     Cervical: No cervical adenopathy.  Skin:    General: Skin is warm.     Findings: No erythema or rash.  Neurological:     General: No focal deficit present.     Mental Status: He is alert and oriented to person, place, and  time.     Gait: Gait normal.  Psychiatric:        Mood and Affect: Mood and affect normal.   ASSESSMENT AND PLAN:  Mr. QUENTAVIOUS RITTENHOUSE was seen today for his routine physical examination.  Orders Placed This Encounter  Procedures   Ambulatory referral to Audiology   EKG 12-Lead   Welcome to Medicare preventive visit We discussed the importance of staying active, physically and mentally, as well as the benefits of a healthy/balance diet. Low impact exercise that involve stretching and strengthing are ideal. Vaccines up to date. We discussed preventive screening for the next 5-10 years, summery of recommendations given in AVS. Fall prevention. Recommend decreasing alcohol consumption. EKG today: NSR, normal axis and intervals, ? IVCD. No significant changes when compared with previous EKG's. Advance directives and end of life discussed, he has POA and living will.Will try to send copy through MyChart.   -     EKG 12-Lead  Bilateral hearing loss, unspecified hearing loss type Gradually getting worse for the past few years. He was evaluated by audiologist in 2014, would like to see same provider, not sure about name and I could not find information.  -     Ambulatory referral to Audiology  Hearing loss of right ear due to cerumen impaction Right ear canal cerumen impaction. After discussing procedure and verbal consent, right ear lavage was performed. Tolerated well. TM no erythema, mild irritation of ear canal, ozzing after ear lavage, he reports no pain   Return in about 1 year (around 10/22/2025) for CPE.  Lacretia Tindall G. Marquetta Weiskopf, MD  [facility]

## 2024-10-22 NOTE — Progress Notes (Signed)
    Immunization History  Administered Date(s) Administered   Influenza Whole 09/15/2008   Influenza, Seasonal, Injecte, Preservative Fre 09/28/2015   Influenza,inj,Quad PF,6+ Mos 09/26/2016, 09/09/2017, 10/02/2018, 08/25/2019   Influenza-Unspecified 09/26/2018, 09/22/2022   Moderna Sars-Covid-2 Vaccination 03/14/2020, 04/15/2020, 11/04/2020, 04/20/2021   PFIZER Comirnaty(Gray Top)Covid-19 Tri-Sucrose Vaccine 09/22/2022   PNEUMOCOCCAL CONJUGATE-20 08/20/2024   Pfizer Covid-19 Vaccine Bivalent Booster 5y-11y 09/18/2021   Td 12/27/2001   Tdap 04/05/2013, 09/10/2022   Zoster Recombinant(Shingrix ) 07/03/2019, 09/05/2019   Health Maintenance Due  Topic Date Due   COVID-19 Vaccine (7 - 2025-26 season) 08/27/2024   Colonoscopy  10/31/2024    Activities of Daily Living    10/22/2024    9:29 AM 10/18/2024    4:31 PM  In your present state of health, do you have any difficulty performing the following activities:  Hearing? 0 0  Vision? 0 0  Difficulty concentrating or making decisions? 0 0  Walking or climbing stairs? 0 0  Dressing or bathing? 0 0  Doing errands, shopping? 0 0  Preparing Food and eating ? N N  Using the Toilet? N N  In the past six months, have you accidently leaked urine? N N  Do you have problems with loss of bowel control? N N  Managing your Medications? N N  Managing your Finances? N N  Housekeeping or managing your Housekeeping? N N   Advanced Directives: Does Patient Have a Medical Advance Directive?: Yes Type of Advance Directive: Healthcare Power of Attorney, Living will Copy of Healthcare Power of Attorney in Chart?: No - copy requested   EKG:  normal EKG, normal sinus rhythm, unchanged from previous tracings, normal sinus rhythm

## 2024-11-28 ENCOUNTER — Ambulatory Visit: Admitting: Audiologist

## 2024-12-31 ENCOUNTER — Encounter: Payer: Self-pay | Admitting: Internal Medicine

## 2025-01-15 ENCOUNTER — Other Ambulatory Visit: Payer: Self-pay

## 2025-01-15 ENCOUNTER — Ambulatory Visit

## 2025-01-15 VITALS — Ht 74.0 in | Wt 160.0 lb

## 2025-01-15 DIAGNOSIS — Z860100 Personal history of colon polyps, unspecified: Secondary | ICD-10-CM

## 2025-01-15 DIAGNOSIS — Z800 Family history of malignant neoplasm of digestive organs: Secondary | ICD-10-CM

## 2025-01-15 NOTE — Progress Notes (Signed)
 Denies allergies to eggs or soy products. Denies complication of anesthesia or sedation. Denies use of weight loss medication. Denies use of O2.   Emmi instructions given for colonoscopy.

## 2025-01-16 ENCOUNTER — Encounter: Payer: Self-pay | Admitting: Internal Medicine

## 2025-01-24 ENCOUNTER — Telehealth: Payer: Self-pay | Admitting: Internal Medicine

## 2025-01-24 NOTE — Telephone Encounter (Signed)
 Good morning Dr. Avram,   We received a call from patient requesting to reschedule upcoming colonoscopy on 01/29/2025. Patient rescheduled due to upcoming weather and family care. Patient rescheduled for 03/11/2025. Thank you.

## 2025-01-29 ENCOUNTER — Encounter: Admitting: Internal Medicine

## 2025-03-11 ENCOUNTER — Encounter: Admitting: Internal Medicine

## 2025-08-21 ENCOUNTER — Encounter: Admitting: Family Medicine
